# Patient Record
Sex: Male | Born: 1935 | Race: White | Hispanic: No | Marital: Married | Smoking: Former smoker
Health system: Southern US, Community
[De-identification: ages and names within clinical notes are randomized; demographics above are authoritative.]

## PROBLEM LIST (undated history)

## (undated) DIAGNOSIS — N289 Disorder of kidney and ureter, unspecified: Secondary | ICD-10-CM

## (undated) DIAGNOSIS — I10 Essential (primary) hypertension: Secondary | ICD-10-CM

## (undated) DIAGNOSIS — E119 Type 2 diabetes mellitus without complications: Secondary | ICD-10-CM

## (undated) DIAGNOSIS — M1990 Unspecified osteoarthritis, unspecified site: Secondary | ICD-10-CM

## (undated) HISTORY — DX: Unspecified osteoarthritis, unspecified site: M19.90

## (undated) HISTORY — DX: Type 2 diabetes mellitus without complications: E11.9

---
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
## 1980-12-04 HISTORY — PX: TESTICLE SURGERY: SHX794

## 2011-01-16 ENCOUNTER — Ambulatory Visit: Payer: Medicare Other | Admitting: Internal Medicine

## 2012-01-05 LAB — HM DIABETES EYE EXAM

## 2012-07-19 ENCOUNTER — Ambulatory Visit (INDEPENDENT_AMBULATORY_CARE_PROVIDER_SITE_OTHER): Payer: Medicare Other | Admitting: Internal Medicine

## 2012-07-19 ENCOUNTER — Encounter: Payer: Self-pay | Admitting: Internal Medicine

## 2012-07-19 VITALS — BP 136/72 | HR 65 | Temp 97.9°F | Resp 16 | Ht 65.5 in | Wt 177.2 lb

## 2012-07-19 DIAGNOSIS — M7730 Calcaneal spur, unspecified foot: Secondary | ICD-10-CM

## 2012-07-19 DIAGNOSIS — M1710 Unilateral primary osteoarthritis, unspecified knee: Secondary | ICD-10-CM

## 2012-07-19 DIAGNOSIS — G4700 Insomnia, unspecified: Secondary | ICD-10-CM

## 2012-07-19 DIAGNOSIS — E785 Hyperlipidemia, unspecified: Secondary | ICD-10-CM

## 2012-07-19 DIAGNOSIS — I10 Essential (primary) hypertension: Secondary | ICD-10-CM

## 2012-07-19 DIAGNOSIS — Z79899 Other long term (current) drug therapy: Secondary | ICD-10-CM

## 2012-07-19 DIAGNOSIS — E119 Type 2 diabetes mellitus without complications: Secondary | ICD-10-CM

## 2012-07-19 LAB — LIPID PANEL
Cholesterol: 163 mg/dL (ref 0–200)
HDL: 48 mg/dL (ref >39)
Total CHOL/HDL Ratio: 3.4 Ratio

## 2012-07-19 LAB — CBC WITH DIFFERENTIAL/PLATELET
Eosinophils Absolute: 0.1 10*3/uL (ref 0.0–0.7)
Eosinophils Relative: 2 % (ref 0–5)
HCT: 37.8 % — ABNORMAL LOW (ref 39.0–52.0)
Hemoglobin: 13.1 g/dL (ref 13.0–17.0)
Lymphocytes Relative: 22 % (ref 12–46)
Lymphs Abs: 1.7 10*3/uL (ref 0.7–4.0)
MCH: 29.5 pg (ref 26.0–34.0)
MCV: 85.1 fL (ref 78.0–100.0)
Monocytes Absolute: 0.7 10*3/uL (ref 0.1–1.0)
Monocytes Relative: 10 % (ref 3–12)
RBC: 4.44 MIL/uL (ref 4.22–5.81)
WBC: 7.6 10*3/uL (ref 4.0–10.5)

## 2012-07-19 LAB — HEPATIC FUNCTION PANEL
ALT: 13 U/L (ref 0–53)
AST: 15 U/L (ref 0–37)
Albumin: 4.3 g/dL (ref 3.5–5.2)
Total Protein: 6.5 g/dL (ref 6.0–8.3)

## 2012-07-19 LAB — BASIC METABOLIC PANEL
BUN: 41 mg/dL — ABNORMAL HIGH (ref 6–23)
Creat: 2.44 mg/dL — ABNORMAL HIGH (ref 0.50–1.35)

## 2012-07-19 MED ORDER — SIMVASTATIN 20 MG PO TABS: 20.0000 mg | ORAL_TABLET | Freq: Every evening | ORAL | Status: DC

## 2012-07-19 MED ORDER — LISINOPRIL-HYDROCHLOROTHIAZIDE 20-25 MG PO TABS: 1.0000 | ORAL_TABLET | Freq: Every day | ORAL | Status: DC

## 2012-07-19 NOTE — Patient Instructions (Signed)
Please schedule labs prior to next visit Chem7, a1c-250.00 

## 2012-07-20 LAB — HEMOGLOBIN A1C: Hgb A1c MFr Bld: 5.8 % — ABNORMAL HIGH (ref <5.7)

## 2012-07-21 DIAGNOSIS — E785 Hyperlipidemia, unspecified: Secondary | ICD-10-CM | POA: Insufficient documentation

## 2012-07-21 DIAGNOSIS — E1165 Type 2 diabetes mellitus with hyperglycemia: Secondary | ICD-10-CM | POA: Insufficient documentation

## 2012-07-21 DIAGNOSIS — M1710 Unilateral primary osteoarthritis, unspecified knee: Secondary | ICD-10-CM | POA: Insufficient documentation

## 2012-07-21 DIAGNOSIS — G4700 Insomnia, unspecified: Secondary | ICD-10-CM | POA: Insufficient documentation

## 2012-07-21 DIAGNOSIS — M7730 Calcaneal spur, unspecified foot: Secondary | ICD-10-CM | POA: Insufficient documentation

## 2012-07-21 DIAGNOSIS — I10 Essential (primary) hypertension: Secondary | ICD-10-CM | POA: Insufficient documentation

## 2012-07-21 NOTE — Assessment & Plan Note (Signed)
Good control. Obtain cbc, chem7, a1c and urine microalbumin. Order given for diabetic shoes

## 2012-07-21 NOTE — Assessment & Plan Note (Addendum)
Obtain lipid/lft. Intolerant of lipitor

## 2012-07-21 NOTE — Assessment & Plan Note (Signed)
Normotensive and stable. Continue current regimen. Monitor bp as outpt and followup in clinic as scheduled.  

## 2012-07-21 NOTE — Progress Notes (Signed)
  Subjective:    Patient ID: Steve Lowery, male    DOB: [birth year], 78 y.o.   MRN: [id number]  HPI Pt presents to clinic for evaluation of multiple medical problems. H/o DM with avg fsbs 97 without hypoglycemia. Tolerates statin tx without myalgias or abn lft. Requests diabetic shoes. UTD with diabetic eye exam. Suffers from insomnia and states takes unspecified medication prn at home.   Past Medical History  Diagnosis Date  . Diabetes mellitus   . Arthritis    Past Surgical History  Procedure Date  . Tonsillectomy and adenoidectomy 1942  . Testicle surgery 1982    Hydra Seal    reports that he has quit smoking. His smoking use included Pipe. He does not have any smokeless tobacco history on file. His alcohol and drug histories not on file. family history is not on file. No Known Allergies    Review of Systems see hpi     Objective:   Physical Exam  Physical Exam  Nursing note and vitals reviewed. Constitutional: Appears well-developed and well-nourished. No distress.  HENT:  Head: Normocephalic and atraumatic.  Right Ear: External ear normal.  Left Ear: External ear normal.  Eyes: Conjunctivae are normal. No scleral icterus.  Neck: Neck supple. Carotid bruit is not present.  Cardiovascular: Normal rate, regular rhythm and normal heart sounds.  Exam reveals no gallop and no friction rub.   No murmur heard. Pulmonary/Chest: Effort normal and breath sounds normal. No respiratory distress. He has no wheezes. no rales.  Lymphadenopathy:    He has no cervical adenopathy.  Neurological:Alert.  Skin: Skin is warm and dry. Not diaphoretic.  Psychiatric: Has a normal mood and affect.  Diabetic foot exam: +2 DP pulses, no diabetic wounds, ulcerations. +callousing.  Monofilament exam nl.       Assessment & Plan:

## 2012-07-22 ENCOUNTER — Telehealth: Payer: Self-pay | Admitting: Internal Medicine

## 2012-07-22 DIAGNOSIS — N289 Disorder of kidney and ureter, unspecified: Secondary | ICD-10-CM

## 2012-07-22 MED ORDER — ZALEPLON 5 MG PO CAPS: 5.0000 mg | ORAL_CAPSULE | Freq: Every day | ORAL | Status: DC

## 2012-07-22 NOTE — Telephone Encounter (Signed)
Is zaleplon ok to send to pharmacy as well ?... 07/22/12@11 :18am/LMB

## 2012-07-22 NOTE — Telephone Encounter (Signed)
Notified pt with md response. Will call office back to schedule appt in the 7-10 days. Notified [facility] called [facility] into pharmacy spoke with Bricks.Marland KitchenMarland Kitchen8/19/13@3 :48pm/LMB

## 2012-07-22 NOTE — Telephone Encounter (Signed)
Patient states that he forgot to tell Dr. Rodena Medin at last visit that he was taking zaleplon 5mg  capsule. 1 or 2 at bedtime.   He would like that medication and metformin called in to [facility] on [address] and he would like to be called when these medications are called in.

## 2012-07-22 NOTE — Telephone Encounter (Signed)
Ok to fill the sonata #60 rf1. Do not refill the metformin. Labs reviewed and kidney test is definitely elevated (cr 2.44).  1) stop metformin. Potentially dangrous to continue to take if kidney function is weak 2) can substitute samples of januvia 100mg  but would need to take only 1/2 tablet qam. 3) monitor fsbs daily after med change 4) obtain chem7(dx-renal insufficiency) one week. 5) f/u appt to discuss kidneys 7-10 days. Will potentially need further testing

## 2012-07-29 NOTE — Telephone Encounter (Signed)
Lab order placed & released to [facility] HP/SLS

## 2012-07-29 NOTE — Addendum Note (Signed)
Addended by: Regis Bill on: 07/29/2012 01:28 PM   Modules accepted: Orders

## 2012-07-30 LAB — BASIC METABOLIC PANEL
BUN: 40 mg/dL — ABNORMAL HIGH (ref 6–23)
Potassium: 4.2 mEq/L (ref 3.5–5.3)
Sodium: 144 mEq/L (ref 135–145)

## 2012-08-02 ENCOUNTER — Ambulatory Visit: Payer: Medicare Other | Admitting: Internal Medicine

## 2012-08-12 ENCOUNTER — Other Ambulatory Visit: Payer: Self-pay | Admitting: *Deleted

## 2012-08-12 MED ORDER — SITAGLIPTIN PHOSPHATE 100 MG PO TABS: 50.0000 mg | ORAL_TABLET | Freq: Every morning | ORAL | Status: DC

## 2012-08-12 NOTE — Progress Notes (Signed)
Continue Januvia 100 mg SIG: Take 0.5 tablet QAM per Vo [facility]; patient informed/SLS

## 2012-08-16 ENCOUNTER — Encounter: Payer: Self-pay | Admitting: Internal Medicine

## 2012-08-16 ENCOUNTER — Ambulatory Visit (INDEPENDENT_AMBULATORY_CARE_PROVIDER_SITE_OTHER): Payer: Medicare Other | Admitting: Internal Medicine

## 2012-08-16 VITALS — BP 92/58 | HR 76 | Temp 97.6°F | Resp 16 | Wt 175.5 lb

## 2012-08-16 DIAGNOSIS — N289 Disorder of kidney and ureter, unspecified: Secondary | ICD-10-CM

## 2012-08-16 DIAGNOSIS — E119 Type 2 diabetes mellitus without complications: Secondary | ICD-10-CM

## 2012-08-16 DIAGNOSIS — I10 Essential (primary) hypertension: Secondary | ICD-10-CM

## 2012-08-16 MED ORDER — SIMVASTATIN 20 MG PO TABS: 20.0000 mg | ORAL_TABLET | Freq: Every evening | ORAL | Status: DC

## 2012-08-16 MED ORDER — LISINOPRIL 20 MG PO TABS: 20.0000 mg | ORAL_TABLET | Freq: Every day | ORAL | Status: DC

## 2012-08-16 MED ORDER — SITAGLIPTIN PHOSPHATE 50 MG PO TABS: 50.0000 mg | ORAL_TABLET | Freq: Every morning | ORAL | Status: DC

## 2012-08-16 NOTE — Progress Notes (Signed)
  Subjective:    Patient ID: Steve Lowery, male    DOB: [birth year], 78 y.o.   MRN: [id number]  HPI Pt presents to clinic for followup of multiple medical problems. Recently noted to have elevated creatinine. Repeated and confirmed. No outside records available. Metformin was stopped. Is taking Januvia 50 mg a day samples about adverse affect. Fingerstick blood sugar log from home reviewed with predominance of values in the low 100s range. No hypoglycemia. Blood pressure low normal without dizziness or syncope. No active complaint. Total time of visit approximately 20 minutes of which greater than 50% of time spent in counseling.  Past Medical History  Diagnosis Date  . Diabetes mellitus   . Arthritis    Past Surgical History  Procedure Date  . Tonsillectomy and adenoidectomy 1942  . Testicle surgery 1982    Hydra Seal    reports that he has quit smoking. His smoking use included Pipe. He does not have any smokeless tobacco history on file. His alcohol and drug histories not on file. family history is not on file. Allergies  Allergen Reactions  . Lipitor (Atorvastatin) Nausea Only    GI Issues.      Review of Systems see hpi     Objective:   Physical Exam  Nursing note and vitals reviewed. Constitutional: He appears well-developed and well-nourished. No distress.  Skin: He is not diaphoretic.          Assessment & Plan:

## 2012-08-23 DIAGNOSIS — N289 Disorder of kidney and ureter, unspecified: Secondary | ICD-10-CM | POA: Insufficient documentation

## 2012-08-23 NOTE — Assessment & Plan Note (Signed)
Schedule renal ultrasound and nephrology referral. Avoid anti-inflammatories and the metformin

## 2012-08-23 NOTE — Assessment & Plan Note (Signed)
Stop hydrochlorothiazide

## 2012-08-23 NOTE — Assessment & Plan Note (Signed)
Avoid metformin. Sample and prescription of Januvia 50 mg a day given.

## 2012-08-26 ENCOUNTER — Ambulatory Visit (HOSPITAL_BASED_OUTPATIENT_CLINIC_OR_DEPARTMENT_OTHER)
Admission: RE | Admit: 2012-08-26 | Discharge: 2012-08-26 | Disposition: A | Discharge status: Home / Self Care | Payer: Medicare Other | Source: Ambulatory Visit | Attending: Internal Medicine | Admitting: Internal Medicine

## 2012-08-26 ENCOUNTER — Other Ambulatory Visit (HOSPITAL_BASED_OUTPATIENT_CLINIC_OR_DEPARTMENT_OTHER): Payer: Medicare Other

## 2012-08-26 DIAGNOSIS — N289 Disorder of kidney and ureter, unspecified: Secondary | ICD-10-CM | POA: Insufficient documentation

## 2012-08-26 DIAGNOSIS — N281 Cyst of kidney, acquired: Secondary | ICD-10-CM | POA: Insufficient documentation

## 2012-08-26 DIAGNOSIS — N269 Renal sclerosis, unspecified: Secondary | ICD-10-CM | POA: Insufficient documentation

## 2012-11-13 LAB — HM DIABETES EYE EXAM: HM Diabetic Eye Exam: NORMAL

## 2012-11-18 ENCOUNTER — Ambulatory Visit: Payer: Medicare Other | Admitting: Internal Medicine

## 2012-12-09 ENCOUNTER — Encounter: Payer: Self-pay | Admitting: Internal Medicine

## 2012-12-09 ENCOUNTER — Ambulatory Visit (INDEPENDENT_AMBULATORY_CARE_PROVIDER_SITE_OTHER): Payer: Medicare Other | Admitting: Internal Medicine

## 2012-12-09 VITALS — BP 126/78 | HR 81 | Temp 98.1°F | Resp 16 | Wt 178.5 lb

## 2012-12-09 DIAGNOSIS — N289 Disorder of kidney and ureter, unspecified: Secondary | ICD-10-CM

## 2012-12-09 DIAGNOSIS — I10 Essential (primary) hypertension: Secondary | ICD-10-CM

## 2012-12-09 DIAGNOSIS — E119 Type 2 diabetes mellitus without complications: Secondary | ICD-10-CM

## 2012-12-09 NOTE — Patient Instructions (Signed)
Please schedule fasting labs prior to next visit Chem7, a1c, urine microalbumin-250.00 and lipid/lft-272.4 

## 2012-12-15 NOTE — Progress Notes (Signed)
  Subjective:    Patient ID: Steve Lowery, male    DOB: 09-Jul-1934, 79 y.o.   MRN: [id number]  HPI  Pt presents to clinic for followup of multiple medical problems. Tolerated januvia after metformin stopped due to renal insufficiency. Reviewed renal US showing bilateral atrophy. Now seeing nephrology as requested. Reviewed fsbs log under good control. No hypoglyemia.  Past Medical History  Diagnosis Date  . Diabetes mellitus   . Arthritis    Past Surgical History  Procedure Date  . Tonsillectomy and adenoidectomy 1942  . Testicle surgery 1982    Hydra Seal    reports that he has quit smoking. His smoking use included Pipe. He does not have any smokeless tobacco history on file. His alcohol and drug histories not on file. family history is not on file. Allergies  Allergen Reactions  . Lipitor (Atorvastatin) Nausea Only    GI Issues.      Review of Systems see hpi     Objective:   Physical Exam  Physical Exam  Nursing note and vitals reviewed. Constitutional: Appears well-developed and well-nourished. No distress.  HENT:  Head: Normocephalic and atraumatic.  Right Ear: External ear normal.  Left Ear: External ear normal.  Eyes: Conjunctivae are normal. No scleral icterus.  Neck: Neck supple. Carotid bruit is not present.  Cardiovascular: Normal rate, regular rhythm and normal heart sounds.  Exam reveals no gallop and no friction rub.   No murmur heard. Pulmonary/Chest: Effort normal and breath sounds normal. No respiratory distress. He has no wheezes. no rales.  Lymphadenopathy:    He has no cervical adenopathy.  Neurological:Alert.  Skin: Skin is warm and dry. Not diaphoretic.  Psychiatric: Has a normal mood and affect.        Assessment & Plan:

## 2012-12-15 NOTE — Assessment & Plan Note (Signed)
Good control. Continue [place]. Avoid metformin secondary to creatinine.

## 2012-12-15 NOTE — Assessment & Plan Note (Signed)
Normotensive and stable. Continue current regimen. Monitor bp as outpt and followup in clinic as scheduled.  

## 2012-12-15 NOTE — Assessment & Plan Note (Signed)
Followed by nephrology. Avoid metformin and nsaids

## 2012-12-23 NOTE — Progress Notes (Signed)
  Subjective:    Patient ID: Steve Lowery, male    DOB: 1934-10-29, 79 y.o.   MRN: [id number]  HPI appt cancelled    Review of Systems     Objective:   Physical Exam        Assessment & Plan:

## 2013-01-14 ENCOUNTER — Other Ambulatory Visit: Payer: Self-pay | Admitting: Internal Medicine

## 2013-01-14 NOTE — Telephone Encounter (Signed)
Please advise refill? Doesn't look like Dr Rodena Medin ever filled?

## 2013-01-14 NOTE — Telephone Encounter (Signed)
So check with patient and see where he has gotten this previously and make sure he is hoping we will take it over and what he uses it for and let me know

## 2013-01-14 NOTE — Telephone Encounter (Signed)
Refill- zaleplon 5mg  capsule. Take 1-2 capsules by mouth at bedtime if needed. Last fill 12.23.13

## 2013-01-20 NOTE — Telephone Encounter (Signed)
Ok to refill if he has had it from dr Rodena Medin. May send Sonata with same sig, same number and 1 refill

## 2013-01-20 NOTE — Telephone Encounter (Signed)
pts spouse states she will  Have pt return our call

## 2013-01-20 NOTE — Telephone Encounter (Signed)
I looked again and Dr Rodena Medin did fill this. Pt states he takes this to help him sleep. Please advise?

## 2013-01-20 NOTE — Telephone Encounter (Signed)
Please advise 

## 2013-01-21 ENCOUNTER — Telehealth: Payer: Self-pay | Admitting: Internal Medicine

## 2013-01-21 MED ORDER — ZALEPLON 5 MG PO CAPS: 5.0000 mg | ORAL_CAPSULE | Freq: Every day | ORAL | Status: DC

## 2013-01-21 NOTE — Telephone Encounter (Signed)
Refill- zaleplon 5mg  capsule. Take 1 to 2 capsules by mouth at bedtime if needed. Last fill 12.13.13

## 2013-01-21 NOTE — Telephone Encounter (Signed)
This has already been done.

## 2013-01-21 NOTE — Addendum Note (Signed)
Addended by: Court Joy on: 01/21/2013 10:00 AM   Modules accepted: Orders

## 2013-02-03 ENCOUNTER — Encounter: Payer: Self-pay | Admitting: Internal Medicine

## 2013-03-04 ENCOUNTER — Telehealth: Payer: Self-pay | Admitting: Internal Medicine

## 2013-03-04 NOTE — Telephone Encounter (Signed)
FYIMyriam Jacobson called from [employer].  Steve Lowery has an appt to est with Dr. Yetta Barre Monday April 7 as a transfer from Dr. Rodena Medin.   [facility] called the [facility] office to let them know that Mr. Sellman has had one appt with them, but has declined to make a follow up.  He told them he does not want to go back there.

## 2013-03-10 ENCOUNTER — Ambulatory Visit (INDEPENDENT_AMBULATORY_CARE_PROVIDER_SITE_OTHER): Payer: Medicare Other | Admitting: Internal Medicine

## 2013-03-10 ENCOUNTER — Encounter: Payer: Self-pay | Admitting: Internal Medicine

## 2013-03-10 ENCOUNTER — Ambulatory Visit: Payer: Medicare Other | Admitting: Internal Medicine

## 2013-03-10 VITALS — BP 120/68 | HR 75 | Temp 98.0°F | Resp 16 | Ht 65.5 in | Wt 174.0 lb

## 2013-03-10 DIAGNOSIS — E1165 Type 2 diabetes mellitus with hyperglycemia: Secondary | ICD-10-CM

## 2013-03-10 DIAGNOSIS — G4700 Insomnia, unspecified: Secondary | ICD-10-CM

## 2013-03-10 DIAGNOSIS — N289 Disorder of kidney and ureter, unspecified: Secondary | ICD-10-CM

## 2013-03-10 DIAGNOSIS — E1129 Type 2 diabetes mellitus with other diabetic kidney complication: Secondary | ICD-10-CM

## 2013-03-10 DIAGNOSIS — D510 Vitamin B12 deficiency anemia due to intrinsic factor deficiency: Secondary | ICD-10-CM | POA: Insufficient documentation

## 2013-03-10 DIAGNOSIS — I10 Essential (primary) hypertension: Secondary | ICD-10-CM

## 2013-03-10 DIAGNOSIS — E781 Pure hyperglyceridemia: Secondary | ICD-10-CM | POA: Insufficient documentation

## 2013-03-10 DIAGNOSIS — E785 Hyperlipidemia, unspecified: Secondary | ICD-10-CM

## 2013-03-10 LAB — HM DIABETES FOOT EXAM: HM Diabetic Foot Exam: NORMAL

## 2013-03-10 MED ORDER — ZALEPLON 5 MG PO CAPS: 5.0000 mg | ORAL_CAPSULE | Freq: Every day | ORAL | Status: DC

## 2013-03-10 MED ORDER — LISINOPRIL 20 MG PO TABS: 20.0000 mg | ORAL_TABLET | Freq: Every day | ORAL | Status: DC

## 2013-03-10 MED ORDER — SITAGLIPTIN PHOSPHATE 50 MG PO TABS: 50.0000 mg | ORAL_TABLET | Freq: Every morning | ORAL | Status: DC

## 2013-03-10 MED ORDER — SIMVASTATIN 20 MG PO TABS: 20.0000 mg | ORAL_TABLET | Freq: Every evening | ORAL | Status: AC

## 2013-03-10 NOTE — Patient Instructions (Signed)
Chronic Renal Insufficiency  Chronic renal insufficiency (also called kidney failure) occurs when there is kidney damage done. The damage prevents the kidneys from working like they should.   The kidneys do many important things. They:  · Filter waste out of the blood.  · Regulate the amount of water and various salts in the blood stream.  · Produce chemicals that:  · Prompt the bone marrow to make red blood cells.  · Regulate blood pressure.  · Keep calcium in balance throughout the bones and the body.  When the kidneys are damaged, they can no longer filter waste products out of the blood. These substances build up in the blood, causing illness.   CAUSES   · Diabetes.  · High blood pressure.  · Glomerular diseases: Conditions that damage the tiny blood vessels (glomeruli) within the kidneys, such as:  · Membranous nephropathy.  · IgA nephropathy.  · Focal segmental glomerulosclerosis.  · Poisons (such as overdoses or misuse of acetaminophen or NSAIDS, or exposure to other toxic substances).  · Kidney injuries.  · Kidney cancer or cancer that spreads to the kidney.  · Medications such as NSAIDs. These problems are rare.  · Kidney stones.  · Alport disease.  · Polycystic kidneys.  SYMPTOMS   Most people do not notice symptoms of kidney failure until their kidney function drops below about 30-40% of normal. Symptoms can include:  · Weakness.  · Tiredness.  · Frequent urination.  · Intense need to urinate.  · Excess bruising.  · Low urine production.  · Blood in the urine.  · Pain in the kidney area.  · Feeling sick to your stomach (nausea).  · Vomiting.  · Unusual bleeding.  · Numbness in hands and feet.  · Swelling in legs, arms and face.  · Confusion.  DIAGNOSIS   Your caregiver will look for signs of kidney failure. Tests to diagnose kidney failure may include:  · Urine tests: May reveal the presence of blood, protein or sugar.  · Blood tests: May show low red blood cell count (anemia) or high levels of waste  products (BUN and creatinine) that are normally filtered out of the bloodstream by the kidneys.  · Imaging tests  These are tests that create pictures of the organs inside the abdomen, such as the kidneys. They may reveal masses growing in the kidneys or blockages to the flow of urine. Possible imaging tests may include:  · Ultrasound.  · CT scan.  · MRI.  · Intravenous pyelogram or IVP. This is a test that involves injecting dye into the bloodstream and then taking a series of x-rays of the kidneys. This allows the kidneys and other parts of the urinary system to be viewed more clearly.  · Kidney biopsy  A small sample of kidney is removed using a special needle. The sample is examined for abnormalities under a microscope.  TREATMENT   Chronic kidney failure cannot usually be cured. The various symptoms are treated, and measures are taken to avoid further kidney damage.  Treatment for mild to moderate kidney failure may include:  · Medication for high blood pressure.  · Good control of diabetes.  · Medication and diet change to improve anemia.  · A low-sodium, low-potassium, low-protein and/or low-cholesterol diet.  · Limiting the quantity of liquids in the diet.  Treatment for more severe kidney failure may require:  · Dialysis  Mechanical methods of filtering the blood.  · Kidney transplant  An operation that   removes the diseased kidney and replaces it with a donated kidney.  HOME CARE INSTRUCTIONS   · Take medication as told by your caregiver.  · Quit smoking if you are a smoker. Talk to your caregiver about a smoking cessation program.  · Follow your prescribed diet.  · If you are prescribed vitamins, take them as told.  SEEK IMMEDIATE MEDICAL CARE IF:  · You start to produce less urine.  · You notice blood in your urine.  · You have increased pain.  · You have increased weakness, fatigue or confusion.  · You notice new swelling.  · You develop a fever.  · You feel that you are having side effects of medicines  prescribed.  Document Released: 08/29/2008 Document Revised: 02/12/2012 Document Reviewed: 12/12/2010  ExitCare® Patient Information ©2013 [employer].

## 2013-03-10 NOTE — Assessment & Plan Note (Signed)
I will recheck his renal function today and will advise further if needed

## 2013-03-10 NOTE — Assessment & Plan Note (Signed)
His BP is well controlled 

## 2013-03-10 NOTE — Assessment & Plan Note (Signed)
Non-fasting lipid panel today

## 2013-03-10 NOTE — Progress Notes (Signed)
Subjective:    Patient ID: Steve Lowery, male    DOB: 1934-07-04, 79 y.o.   MRN: [id number]  Hypertension This is a chronic problem. The current episode started more than 1 year ago. The problem has been gradually improving since onset. The problem is controlled. Pertinent negatives include no anxiety, blurred vision, chest pain, headaches, malaise/fatigue, neck pain, orthopnea, palpitations, peripheral edema, PND, shortness of breath or sweats. There are no associated agents to hypertension. Past treatments include ACE inhibitors. Compliance problems include exercise and diet.  Hypertensive end-organ damage includes kidney disease. Identifiable causes of hypertension include chronic renal disease.      Review of Systems  Constitutional: Negative.  Negative for fever, chills, malaise/fatigue, diaphoresis, activity change, appetite change, fatigue and unexpected weight change.  HENT: Negative.  Negative for neck pain.   Eyes: Negative.  Negative for blurred vision.  Respiratory: Negative.  Negative for apnea, cough, choking, chest tightness, shortness of breath, wheezing and stridor.   Cardiovascular: Negative.  Negative for chest pain, palpitations, orthopnea and PND.  Gastrointestinal: Negative.  Negative for nausea, vomiting, abdominal pain, diarrhea, constipation and blood in stool.  Endocrine: Negative.   Genitourinary: Negative.   Musculoskeletal: Negative.  Negative for myalgias, back pain, joint swelling, arthralgias and gait problem.  Skin: Negative.  Negative for color change, pallor, rash and wound.  Allergic/Immunologic: Negative.   Neurological: Negative for dizziness, weakness, light-headedness, numbness and headaches.  Hematological: Negative.  Negative for adenopathy. Does not bruise/bleed easily.  Psychiatric/Behavioral: Positive for sleep disturbance (dfa). Negative for suicidal ideas, hallucinations, behavioral problems, confusion, self-injury, dysphoric mood, decreased  concentration and agitation. The patient is not nervous/anxious and is not hyperactive.        Objective:   Physical Exam  Vitals reviewed. Constitutional: He is oriented to person, place, and time. He appears well-developed and well-nourished. No distress.  HENT:  Head: Normocephalic and atraumatic.  Mouth/Throat: Oropharynx is clear and moist. No oropharyngeal exudate.  Eyes: Conjunctivae are normal. Right eye exhibits no discharge. Left eye exhibits no discharge. No scleral icterus.  Neck: Normal range of motion. Neck supple. No JVD present. No tracheal deviation present. No thyromegaly present.  Cardiovascular: Normal rate, regular rhythm, normal heart sounds and intact distal pulses.  Exam reveals no gallop and no friction rub.   No murmur heard. Pulmonary/Chest: Effort normal and breath sounds normal. No stridor. No respiratory distress. He has no wheezes. He has no rales. He exhibits no tenderness.  Abdominal: Soft. Bowel sounds are normal. He exhibits no distension and no mass. There is no tenderness. There is no rebound and no guarding.  Musculoskeletal: Normal range of motion. He exhibits edema (trace edema in BLE). He exhibits no tenderness.  Lymphadenopathy:    He has no cervical adenopathy.  Neurological: He is oriented to person, place, and time.  Skin: Skin is warm and dry. No rash noted. He is not diaphoretic. No erythema. No pallor.  Psychiatric: He has a normal mood and affect. His behavior is normal. Judgment and thought content normal.      Lab Results  Component Value Date   WBC 7.6 07/19/2012   HGB 13.1 07/19/2012   HCT 37.8* 07/19/2012   PLT 182 07/19/2012   GLUCOSE 199* 07/29/2012   CHOL 163 07/19/2012   TRIG 237* 07/19/2012   HDL 48 07/19/2012   LDLCALC 68 07/19/2012   ALT 13 07/19/2012   AST 15 07/19/2012   NA 144 07/29/2012   K 4.2 07/29/2012   CL 105 07/29/2012  CREATININE 2.41* 07/29/2012   BUN 40* 07/29/2012   CO2 28 07/29/2012   HGBA1C 5.8* 07/19/2012       Assessment & Plan:

## 2013-03-10 NOTE — Assessment & Plan Note (Signed)
I will recheck his A1C today and will advise further, will also monitor his renal function

## 2013-03-10 NOTE — Assessment & Plan Note (Signed)
He is doing well on zocor 

## 2013-03-10 NOTE — Assessment & Plan Note (Signed)
The software blocked the folate and B12 ordered I will recheck his CBC and iron level as those tests were allowed today

## 2013-03-10 NOTE — Assessment & Plan Note (Signed)
He will continue sonata as needed

## 2013-03-19 ENCOUNTER — Encounter: Payer: Self-pay | Admitting: Internal Medicine

## 2013-03-20 MED ORDER — OMEPRAZOLE 20 MG PO CPDR: 20.0000 mg | DELAYED_RELEASE_CAPSULE | Freq: Every day | ORAL | Status: AC | PRN

## 2013-03-24 ENCOUNTER — Encounter: Payer: Self-pay | Admitting: Internal Medicine

## 2013-03-24 ENCOUNTER — Ambulatory Visit (INDEPENDENT_AMBULATORY_CARE_PROVIDER_SITE_OTHER): Payer: Medicare Other | Admitting: Internal Medicine

## 2013-03-24 ENCOUNTER — Ambulatory Visit (INDEPENDENT_AMBULATORY_CARE_PROVIDER_SITE_OTHER)
Admission: RE | Admit: 2013-03-24 | Discharge: 2013-03-24 | Disposition: A | Discharge status: Home / Self Care | Payer: Medicare Other | Source: Ambulatory Visit | Attending: Internal Medicine | Admitting: Internal Medicine

## 2013-03-24 VITALS — BP 150/70 | HR 61 | Temp 97.6°F | Resp 16

## 2013-03-24 DIAGNOSIS — E1129 Type 2 diabetes mellitus with other diabetic kidney complication: Secondary | ICD-10-CM

## 2013-03-24 DIAGNOSIS — N289 Disorder of kidney and ureter, unspecified: Secondary | ICD-10-CM

## 2013-03-24 DIAGNOSIS — M25512 Pain in left shoulder: Secondary | ICD-10-CM

## 2013-03-24 DIAGNOSIS — I10 Essential (primary) hypertension: Secondary | ICD-10-CM

## 2013-03-24 DIAGNOSIS — M25519 Pain in unspecified shoulder: Secondary | ICD-10-CM

## 2013-03-24 NOTE — Progress Notes (Signed)
Pt refused to have his weight taken.

## 2013-03-24 NOTE — Patient Instructions (Signed)
Shoulder Pain The shoulder is the joint that connects your arms to your body. The bones that form the shoulder joint include the upper arm bone (humerus), the shoulder blade (scapula), and the collarbone (clavicle). The top of the humerus is shaped like a ball and fits into a rather flat socket on the scapula (glenoid cavity). A combination of muscles and strong, fibrous tissues that connect muscles to bones (tendons) support your shoulder joint and hold the ball in the socket. Small, fluid-filled sacs (bursae) are located in different areas of the joint. They act as cushions between the bones and the overlying soft tissues and help reduce friction between the gliding tendons and the bone as you move your arm. Your shoulder joint allows a wide range of motion in your arm. This range of motion allows you to do things like scratch your back or throw a ball. However, this range of motion also makes your shoulder more prone to pain from overuse and injury. Causes of shoulder pain can originate from both injury and overuse and usually can be grouped in the following four categories:  Redness, swelling, and pain (inflammation) of the tendon (tendinitis) or the bursae (bursitis).  Instability, such as a dislocation of the joint.  Inflammation of the joint (arthritis).  Broken bone (fracture). HOME CARE INSTRUCTIONS   Apply ice to the sore area.  Put ice in a plastic bag.  Place a towel between your skin and the bag.  Leave the ice on for 15 to 20 minutes, 3 to 4 times per day for the first 2 days.  If you have a shoulder sling or immobilizer, wear it as long as your caregiver instructs. Only remove it to shower or bathe. Move your arm as little as possible, but keep your hand moving to prevent swelling.  Only take over-the-counter or prescription medicines for pain, discomfort, or fever as directed by your caregiver. SEEK MEDICAL CARE IF:   Your shoulder pain increases, or new pain develops in  your arm, hand, or fingers.  Your hand or fingers become cold and numb.  Your pain is not relieved with medicines. SEEK IMMEDIATE MEDICAL CARE IF:   Your arm, hand, or fingers are numb or tingling.  Your arm, hand, or fingers are significantly swollen or turn white or blue. MAKE SURE YOU:   Understand these instructions.  Will watch your condition.  Will get help right away if you are not doing well or get worse. Document Released: 08/30/2005 Document Revised: 02/12/2012 Document Reviewed: 11/04/2011 ExitCare Patient Information 2013 [employer].  

## 2013-03-25 ENCOUNTER — Encounter: Payer: Self-pay | Admitting: Internal Medicine

## 2013-03-25 ENCOUNTER — Telehealth: Payer: Self-pay | Admitting: *Deleted

## 2013-03-25 NOTE — Assessment & Plan Note (Signed)
Plain film is normal He does not want anything for pain I suspect that this is m/s strain

## 2013-03-25 NOTE — Assessment & Plan Note (Signed)
He refuses to do his lab work

## 2013-03-25 NOTE — Assessment & Plan Note (Signed)
He has adequate BP control 

## 2013-03-25 NOTE — Telephone Encounter (Signed)
Left msg on triage requesting xray results. Called pt back inform him md release results to my chart. Xray was normal.../lmb

## 2013-03-25 NOTE — Progress Notes (Signed)
  Subjective:    Patient ID: Steve Lowery, male    DOB: 1934-01-25, 79 y.o.   MRN: [id number]  Shoulder Pain  The pain is present in the left shoulder. This is a new problem. The current episode started in the past 7 days. There has been no history of extremity trauma. The problem occurs intermittently. The problem has been gradually improving. The quality of the pain is described as aching. The pain is at a severity of 2/10. The pain is mild. Associated symptoms include stiffness. Pertinent negatives include no fever, inability to bear weight, itching, joint locking, joint swelling, limited range of motion, numbness or tingling. The symptoms are aggravated by activity. He has tried acetaminophen for the symptoms. The treatment provided moderate relief. Family history does not include gout or rheumatoid arthritis. His past medical history is significant for diabetes and osteoarthritis. There is no history of gout or rheumatoid arthritis.      Review of Systems  Constitutional: Negative.  Negative for fever.  HENT: Negative.   Eyes: Negative.   Respiratory: Negative.  Negative for cough, chest tightness, shortness of breath, wheezing and stridor.   Cardiovascular: Negative.  Negative for chest pain, palpitations and leg swelling.  Gastrointestinal: Negative.  Negative for nausea, vomiting, diarrhea, constipation, abdominal distention and anal bleeding.  Endocrine: Negative.   Genitourinary: Negative.   Musculoskeletal: Positive for arthralgias and stiffness. Negative for myalgias, back pain, joint swelling, gait problem and gout.  Skin: Negative.  Negative for itching.  Allergic/Immunologic: Negative.   Neurological: Negative.  Negative for dizziness, tingling and numbness.  Hematological: Negative.  Negative for adenopathy. Does not bruise/bleed easily.  Psychiatric/Behavioral: Negative.        Objective:   Physical Exam  Vitals reviewed. Constitutional: He is oriented to person, place,  and time. He appears well-developed and well-nourished. No distress.  HENT:  Head: Normocephalic and atraumatic.  Mouth/Throat: Oropharynx is clear and moist. No oropharyngeal exudate.  Eyes: Conjunctivae are normal. Right eye exhibits no discharge. Left eye exhibits no discharge. No scleral icterus.  Neck: Normal range of motion. Neck supple. No JVD present. No tracheal deviation present. No thyromegaly present.  Cardiovascular: Normal rate, regular rhythm, normal heart sounds and intact distal pulses.  Exam reveals no gallop and no friction rub.   No murmur heard. Pulmonary/Chest: Effort normal and breath sounds normal. No stridor. No respiratory distress. He has no wheezes. He has no rales. He exhibits no tenderness.  Abdominal: Soft. Bowel sounds are normal. He exhibits no distension and no mass. There is no tenderness. There is no rebound and no guarding.  Musculoskeletal: Normal range of motion. He exhibits no edema and no tenderness.       Left shoulder: He exhibits tenderness (mild ttp in the a/c joint) and bony tenderness. He exhibits normal range of motion, no swelling, no effusion, no crepitus, no deformity, no laceration, no pain, no spasm, normal pulse and normal strength.  Lymphadenopathy:    He has no cervical adenopathy.  Neurological: He is oriented to person, place, and time.  Skin: Skin is warm and dry. No rash noted. He is not diaphoretic. No erythema. No pallor.  Psychiatric: He has a normal mood and affect. His behavior is normal. Judgment and thought content normal.          Assessment & Plan:

## 2013-03-25 NOTE — Assessment & Plan Note (Signed)
He refuses to do lab work

## 2013-06-17 ENCOUNTER — Other Ambulatory Visit: Payer: Self-pay | Admitting: Internal Medicine

## 2013-10-09 ENCOUNTER — Other Ambulatory Visit: Payer: Self-pay

## 2013-10-17 ENCOUNTER — Telehealth: Payer: Self-pay

## 2013-10-17 NOTE — Telephone Encounter (Signed)
Pt left a message on my vm stating that he called and left a message over 24 hours ago and my vm states I would return any calls within 24 hours? Pt stated he would like to know why his "damn" call wasn't returned. I tried to call pt back but got his answering machine. I'm assuming he's calling about his wife since she is a pt of Dr Elby Showers?  I apologized on pts machine and stated that I had been out of the office since Nov 10, 14 in meetings and didn't return until Nov. 14, 14 around 10.

## 2015-03-04 ENCOUNTER — Inpatient Hospital Stay (HOSPITAL_COMMUNITY)
Admission: EM | Admit: 2015-03-04 | Discharge: 2015-03-08 | DRG: 474 | Disposition: A | Discharge status: Skilled Nursing Facility | Payer: Medicare Other | Attending: Internal Medicine | Admitting: Internal Medicine

## 2015-03-04 ENCOUNTER — Inpatient Hospital Stay (HOSPITAL_COMMUNITY): Payer: Medicare Other

## 2015-03-04 ENCOUNTER — Emergency Department (HOSPITAL_COMMUNITY): Payer: Medicare Other

## 2015-03-04 ENCOUNTER — Encounter (HOSPITAL_COMMUNITY)
Admission: EM | Disposition: A | Discharge status: Skilled Nursing Facility | Payer: Self-pay | Source: Home / Self Care | Attending: Internal Medicine

## 2015-03-04 ENCOUNTER — Inpatient Hospital Stay (HOSPITAL_COMMUNITY): Payer: Medicare Other | Admitting: Certified Registered Nurse Anesthetist

## 2015-03-04 ENCOUNTER — Encounter (HOSPITAL_COMMUNITY): Payer: Self-pay | Admitting: Emergency Medicine

## 2015-03-04 DIAGNOSIS — D72829 Elevated white blood cell count, unspecified: Secondary | ICD-10-CM | POA: Diagnosis present

## 2015-03-04 DIAGNOSIS — Z888 Allergy status to other drugs, medicaments and biological substances status: Secondary | ICD-10-CM | POA: Diagnosis not present

## 2015-03-04 DIAGNOSIS — Z79899 Other long term (current) drug therapy: Secondary | ICD-10-CM

## 2015-03-04 DIAGNOSIS — L97509 Non-pressure chronic ulcer of other part of unspecified foot with unspecified severity: Secondary | ICD-10-CM | POA: Diagnosis present

## 2015-03-04 DIAGNOSIS — E1140 Type 2 diabetes mellitus with diabetic neuropathy, unspecified: Secondary | ICD-10-CM | POA: Diagnosis present

## 2015-03-04 DIAGNOSIS — I10 Essential (primary) hypertension: Secondary | ICD-10-CM | POA: Diagnosis not present

## 2015-03-04 DIAGNOSIS — Z6827 Body mass index (BMI) 27.0-27.9, adult: Secondary | ICD-10-CM | POA: Diagnosis not present

## 2015-03-04 DIAGNOSIS — E876 Hypokalemia: Secondary | ICD-10-CM | POA: Diagnosis present

## 2015-03-04 DIAGNOSIS — E785 Hyperlipidemia, unspecified: Secondary | ICD-10-CM | POA: Diagnosis present

## 2015-03-04 DIAGNOSIS — M14672 Charcot's joint, left ankle and foot: Secondary | ICD-10-CM | POA: Diagnosis present

## 2015-03-04 DIAGNOSIS — E11628 Type 2 diabetes mellitus with other skin complications: Secondary | ICD-10-CM | POA: Diagnosis present

## 2015-03-04 DIAGNOSIS — Z7982 Long term (current) use of aspirin: Secondary | ICD-10-CM

## 2015-03-04 DIAGNOSIS — D510 Vitamin B12 deficiency anemia due to intrinsic factor deficiency: Secondary | ICD-10-CM | POA: Diagnosis present

## 2015-03-04 DIAGNOSIS — E1169 Type 2 diabetes mellitus with other specified complication: Secondary | ICD-10-CM | POA: Diagnosis not present

## 2015-03-04 DIAGNOSIS — M009 Pyogenic arthritis, unspecified: Secondary | ICD-10-CM | POA: Diagnosis present

## 2015-03-04 DIAGNOSIS — M869 Osteomyelitis, unspecified: Secondary | ICD-10-CM | POA: Diagnosis present

## 2015-03-04 DIAGNOSIS — Z87891 Personal history of nicotine dependence: Secondary | ICD-10-CM | POA: Diagnosis not present

## 2015-03-04 DIAGNOSIS — I129 Hypertensive chronic kidney disease with stage 1 through stage 4 chronic kidney disease, or unspecified chronic kidney disease: Secondary | ICD-10-CM | POA: Diagnosis present

## 2015-03-04 DIAGNOSIS — N184 Chronic kidney disease, stage 4 (severe): Secondary | ICD-10-CM | POA: Diagnosis present

## 2015-03-04 DIAGNOSIS — E43 Unspecified severe protein-calorie malnutrition: Secondary | ICD-10-CM | POA: Diagnosis present

## 2015-03-04 DIAGNOSIS — M726 Necrotizing fasciitis: Secondary | ICD-10-CM | POA: Diagnosis present

## 2015-03-04 DIAGNOSIS — D631 Anemia in chronic kidney disease: Secondary | ICD-10-CM | POA: Diagnosis present

## 2015-03-04 DIAGNOSIS — M79672 Pain in left foot: Secondary | ICD-10-CM | POA: Diagnosis present

## 2015-03-04 DIAGNOSIS — Z833 Family history of diabetes mellitus: Secondary | ICD-10-CM

## 2015-03-04 DIAGNOSIS — L089 Local infection of the skin and subcutaneous tissue, unspecified: Secondary | ICD-10-CM | POA: Diagnosis present

## 2015-03-04 DIAGNOSIS — L0390 Cellulitis, unspecified: Secondary | ICD-10-CM | POA: Diagnosis present

## 2015-03-04 DIAGNOSIS — M1990 Unspecified osteoarthritis, unspecified site: Secondary | ICD-10-CM | POA: Diagnosis present

## 2015-03-04 HISTORY — DX: Disorder of kidney and ureter, unspecified: N28.9

## 2015-03-04 HISTORY — PX: AMPUTATION: SHX166

## 2015-03-04 HISTORY — DX: Essential (primary) hypertension: I10

## 2015-03-04 LAB — COMPREHENSIVE METABOLIC PANEL
ALT: 13 U/L (ref 0–53)
AST: 22 U/L (ref 0–37)
Albumin: 2.3 g/dL — ABNORMAL LOW (ref 3.5–5.2)
Alkaline Phosphatase: 68 U/L (ref 39–117)
Anion gap: 13 (ref 5–15)
BUN: 47 mg/dL — AB (ref 6–23)
CALCIUM: 8.6 mg/dL (ref 8.4–10.5)
CHLORIDE: 104 mmol/L (ref 96–112)
CO2: 21 mmol/L (ref 19–32)
Creatinine, Ser: 2.41 mg/dL — ABNORMAL HIGH (ref 0.50–1.35)
GFR calc non Af Amer: 24 mL/min — ABNORMAL LOW (ref >90)
GFR, EST AFRICAN AMERICAN: 28 mL/min — AB (ref >90)
GLUCOSE: 179 mg/dL — AB (ref 70–99)
Potassium: 3.3 mmol/L — ABNORMAL LOW (ref 3.5–5.1)
Sodium: 138 mmol/L (ref 135–145)
Total Bilirubin: 1 mg/dL (ref 0.3–1.2)
Total Protein: 6 g/dL (ref 6.0–8.3)

## 2015-03-04 LAB — PHOSPHORUS: Phosphorus: 3.1 mg/dL (ref 2.3–4.6)

## 2015-03-04 LAB — BASIC METABOLIC PANEL
Anion gap: 13 (ref 5–15)
BUN: 53 mg/dL — AB (ref 6–23)
CO2: 21 mmol/L (ref 19–32)
CREATININE: 2.59 mg/dL — AB (ref 0.50–1.35)
Calcium: 8.8 mg/dL (ref 8.4–10.5)
Chloride: 102 mmol/L (ref 96–112)
GFR calc Af Amer: 26 mL/min — ABNORMAL LOW (ref >90)
GFR, EST NON AFRICAN AMERICAN: 22 mL/min — AB (ref >90)
GLUCOSE: 180 mg/dL — AB (ref 70–99)
Potassium: 3.2 mmol/L — ABNORMAL LOW (ref 3.5–5.1)
SODIUM: 136 mmol/L (ref 135–145)

## 2015-03-04 LAB — CBC WITH DIFFERENTIAL/PLATELET
BASOS ABS: 0 10*3/uL (ref 0.0–0.1)
BASOS PCT: 0 % (ref 0–1)
EOS PCT: 1 % (ref 0–5)
Eosinophils Absolute: 0.1 10*3/uL (ref 0.0–0.7)
HEMATOCRIT: 34.9 % — AB (ref 39.0–52.0)
Hemoglobin: 11.5 g/dL — ABNORMAL LOW (ref 13.0–17.0)
Lymphocytes Relative: 5 % — ABNORMAL LOW (ref 12–46)
Lymphs Abs: 0.7 10*3/uL (ref 0.7–4.0)
MCH: 28 pg (ref 26.0–34.0)
MCHC: 33 g/dL (ref 30.0–36.0)
MCV: 85.1 fL (ref 78.0–100.0)
MONO ABS: 1.4 10*3/uL — AB (ref 0.1–1.0)
Monocytes Relative: 10 % (ref 3–12)
Neutro Abs: 11.2 10*3/uL — ABNORMAL HIGH (ref 1.7–7.7)
Neutrophils Relative %: 84 % — ABNORMAL HIGH (ref 43–77)
Platelets: 338 10*3/uL (ref 150–400)
RBC: 4.1 MIL/uL — ABNORMAL LOW (ref 4.22–5.81)
RDW: 13.6 % (ref 11.5–15.5)
WBC: 13.3 10*3/uL — AB (ref 4.0–10.5)

## 2015-03-04 LAB — GLUCOSE, CAPILLARY
GLUCOSE-CAPILLARY: 116 mg/dL — AB (ref 70–99)
Glucose-Capillary: 131 mg/dL — ABNORMAL HIGH (ref 70–99)

## 2015-03-04 LAB — SEDIMENTATION RATE: Sed Rate: 120 mm/hr — ABNORMAL HIGH (ref 0–16)

## 2015-03-04 LAB — TYPE AND SCREEN
ABO/RH(D): AB POS
Antibody Screen: NEGATIVE

## 2015-03-04 LAB — TSH: TSH: 0.462 u[IU]/mL (ref 0.350–4.500)

## 2015-03-04 LAB — I-STAT CG4 LACTIC ACID, ED
LACTIC ACID, VENOUS: 1.45 mmol/L (ref 0.5–2.0)
Lactic Acid, Venous: 1.33 mmol/L (ref 0.5–2.0)

## 2015-03-04 LAB — MAGNESIUM: Magnesium: 2 mg/dL (ref 1.5–2.5)

## 2015-03-04 LAB — SURGICAL PCR SCREEN
MRSA, PCR: NEGATIVE
Staphylococcus aureus: NEGATIVE

## 2015-03-04 SURGERY — AMPUTATION BELOW KNEE
Anesthesia: General | Site: Leg Lower | Laterality: Left

## 2015-03-04 MED ORDER — LACTATED RINGERS IV SOLN
INTRAVENOUS | Status: DC | PRN
  Administered 2015-03-04: 22:00:00 via INTRAVENOUS

## 2015-03-04 MED ORDER — ACETAMINOPHEN 10 MG/ML IV SOLN
1000.0000 mg | Freq: Once | INTRAVENOUS | Status: AC
  Administered 2015-03-04: 1000 mg via INTRAVENOUS
  Filled 2015-03-04: qty 100

## 2015-03-04 MED ORDER — 0.9 % SODIUM CHLORIDE (POUR BTL) OPTIME
TOPICAL | Status: DC | PRN
  Administered 2015-03-04: 3000 mL

## 2015-03-04 MED ORDER — VANCOMYCIN HCL IN DEXTROSE 750-5 MG/150ML-% IV SOLN
750.0000 mg | INTRAVENOUS | Status: DC
  Administered 2015-03-05 – 2015-03-06 (×2): 750 mg via INTRAVENOUS
  Filled 2015-03-04 (×3): qty 150

## 2015-03-04 MED ORDER — MORPHINE SULFATE 2 MG/ML IJ SOLN
1.0000 mg | INTRAMUSCULAR | Status: DC | PRN
  Administered 2015-03-05 (×2): 1 mg via INTRAVENOUS
  Filled 2015-03-04 (×2): qty 1

## 2015-03-04 MED ORDER — PANTOPRAZOLE SODIUM 40 MG PO TBEC
40.0000 mg | DELAYED_RELEASE_TABLET | Freq: Every day | ORAL | Status: DC
  Administered 2015-03-05 – 2015-03-08 (×3): 40 mg via ORAL
  Filled 2015-03-04 (×4): qty 1

## 2015-03-04 MED ORDER — OMEGA-3-ACID ETHYL ESTERS 1 G PO CAPS
1.0000 g | ORAL_CAPSULE | Freq: Every day | ORAL | Status: DC
  Administered 2015-03-05 – 2015-03-08 (×4): 1 g via ORAL
  Filled 2015-03-04 (×4): qty 1

## 2015-03-04 MED ORDER — PIPERACILLIN-TAZOBACTAM 3.375 G IVPB
3.3750 g | Freq: Three times a day (TID) | INTRAVENOUS | Status: DC
  Administered 2015-03-04 – 2015-03-07 (×10): 3.375 g via INTRAVENOUS
  Filled 2015-03-04 (×10): qty 50

## 2015-03-04 MED ORDER — FENTANYL CITRATE 0.05 MG/ML IJ SOLN
INTRAMUSCULAR | Status: AC
  Filled 2015-03-04: qty 5

## 2015-03-04 MED ORDER — LIDOCAINE HCL (CARDIAC) 20 MG/ML IV SOLN
INTRAVENOUS | Status: AC
  Filled 2015-03-04: qty 5

## 2015-03-04 MED ORDER — PROPOFOL 10 MG/ML IV BOLUS
INTRAVENOUS | Status: DC | PRN
  Administered 2015-03-04: 150 mg via INTRAVENOUS

## 2015-03-04 MED ORDER — VANCOMYCIN HCL IN DEXTROSE 1-5 GM/200ML-% IV SOLN
1000.0000 mg | Freq: Once | INTRAVENOUS | Status: AC
  Administered 2015-03-04: 1000 mg via INTRAVENOUS
  Filled 2015-03-04: qty 200

## 2015-03-04 MED ORDER — ACETAMINOPHEN 650 MG RE SUPP: 650.0000 mg | Freq: Four times a day (QID) | RECTAL | Status: DC | PRN

## 2015-03-04 MED ORDER — ACETAMINOPHEN 325 MG PO TABS: 650.0000 mg | ORAL_TABLET | Freq: Four times a day (QID) | ORAL | Status: DC | PRN

## 2015-03-04 MED ORDER — PHENYLEPHRINE HCL 10 MG/ML IJ SOLN
INTRAMUSCULAR | Status: DC | PRN
  Administered 2015-03-04 (×2): 80 ug via INTRAVENOUS
  Administered 2015-03-04: 120 ug via INTRAVENOUS

## 2015-03-04 MED ORDER — HYDROMORPHONE HCL 2 MG/ML IJ SOLN
INTRAMUSCULAR | Status: AC
  Filled 2015-03-04: qty 1

## 2015-03-04 MED ORDER — PIPERACILLIN-TAZOBACTAM 3.375 G IVPB
3.3750 g | Freq: Once | INTRAVENOUS | Status: AC
  Administered 2015-03-04: 3.375 g via INTRAVENOUS
  Filled 2015-03-04: qty 50

## 2015-03-04 MED ORDER — PROPOFOL 10 MG/ML IV BOLUS
INTRAVENOUS | Status: AC
  Filled 2015-03-04: qty 20

## 2015-03-04 MED ORDER — ONDANSETRON HCL 4 MG PO TABS
4.0000 mg | ORAL_TABLET | Freq: Four times a day (QID) | ORAL | Status: DC | PRN
Start: 2015-03-04 — End: 2015-03-05

## 2015-03-04 MED ORDER — INSULIN ASPART 100 UNIT/ML ~~LOC~~ SOLN
0.0000 [IU] | Freq: Three times a day (TID) | SUBCUTANEOUS | Status: DC
  Administered 2015-03-06: 1 [IU] via SUBCUTANEOUS

## 2015-03-04 MED ORDER — ASPIRIN EC 81 MG PO TBEC: 81.0000 mg | DELAYED_RELEASE_TABLET | Freq: Every day | ORAL | Status: DC

## 2015-03-04 MED ORDER — FENTANYL CITRATE 0.05 MG/ML IJ SOLN
INTRAMUSCULAR | Status: DC | PRN
  Administered 2015-03-04 (×2): 50 ug via INTRAVENOUS
  Administered 2015-03-04: 100 ug via INTRAVENOUS
  Administered 2015-03-05: 50 ug via INTRAVENOUS

## 2015-03-04 MED ORDER — SUCCINYLCHOLINE CHLORIDE 20 MG/ML IJ SOLN
INTRAMUSCULAR | Status: DC | PRN
  Administered 2015-03-04: 100 mg via INTRAVENOUS

## 2015-03-04 MED ORDER — ENOXAPARIN SODIUM 30 MG/0.3ML ~~LOC~~ SOLN
30.0000 mg | SUBCUTANEOUS | Status: DC
  Filled 2015-03-04: qty 0.3

## 2015-03-04 MED ORDER — SODIUM CHLORIDE 0.9 % IV SOLN
INTRAVENOUS | Status: AC
  Administered 2015-03-04 – 2015-03-05 (×2): via INTRAVENOUS

## 2015-03-04 MED ORDER — HYDROMORPHONE HCL 1 MG/ML IJ SOLN
INTRAMUSCULAR | Status: DC | PRN
  Administered 2015-03-04 – 2015-03-05 (×2): 0.5 mg via INTRAVENOUS

## 2015-03-04 MED ORDER — AMLODIPINE BESYLATE 10 MG PO TABS
10.0000 mg | ORAL_TABLET | Freq: Every day | ORAL | Status: DC
  Administered 2015-03-05 – 2015-03-08 (×3): 10 mg via ORAL
  Filled 2015-03-04 (×4): qty 1

## 2015-03-04 MED ORDER — POTASSIUM CHLORIDE 10 MEQ/100ML IV SOLN
10.0000 meq | INTRAVENOUS | Status: AC
  Administered 2015-03-04 – 2015-03-05 (×3): 10 meq via INTRAVENOUS
  Filled 2015-03-04 (×3): qty 100

## 2015-03-04 MED ORDER — SIMVASTATIN 20 MG PO TABS
20.0000 mg | ORAL_TABLET | Freq: Every evening | ORAL | Status: DC
  Administered 2015-03-05 – 2015-03-07 (×3): 20 mg via ORAL
  Filled 2015-03-04 (×5): qty 1

## 2015-03-04 MED ORDER — ONDANSETRON HCL 4 MG/2ML IJ SOLN
4.0000 mg | Freq: Four times a day (QID) | INTRAMUSCULAR | Status: DC | PRN
Start: 2015-03-04 — End: 2015-03-05

## 2015-03-04 MED ORDER — CLINDAMYCIN PHOSPHATE 600 MG/50ML IV SOLN
600.0000 mg | Freq: Once | INTRAVENOUS | Status: AC
  Administered 2015-03-04: 600 mg via INTRAVENOUS
  Filled 2015-03-04: qty 50

## 2015-03-04 MED ORDER — SODIUM CHLORIDE 0.9 % IV SOLN: INTRAVENOUS | Status: DC

## 2015-03-04 SURGICAL SUPPLY — 42 items
BAG ZIPLOCK 12X15 (MISCELLANEOUS) ×3 IMPLANT
BANDAGE ELASTIC 4 VELCRO ST LF (GAUZE/BANDAGES/DRESSINGS) IMPLANT
BANDAGE ELASTIC 6 VELCRO ST LF (GAUZE/BANDAGES/DRESSINGS) ×3 IMPLANT
BANDAGE ESMARK 6X9 LF (GAUZE/BANDAGES/DRESSINGS) IMPLANT
BLADE GIGLI SAW 510M (BLADE) ×3 IMPLANT
BLADE SAW SGTL 18X1.27X75 (BLADE) ×2 IMPLANT
BLADE SAW SGTL 18X1.27X75MM (BLADE) ×1
BLADE SURG SZ10 CARB STEEL (BLADE) IMPLANT
BNDG ELASTIC 6X10 VLCR STRL LF (GAUZE/BANDAGES/DRESSINGS) ×3 IMPLANT
BNDG ESMARK 6X9 LF (GAUZE/BANDAGES/DRESSINGS)
COVER MAYO STAND STRL (DRAPES) ×3 IMPLANT
CUFF TOURN SGL QUICK 34 (TOURNIQUET CUFF) ×2
CUFF TRNQT CYL 34X4X40X1 (TOURNIQUET CUFF) ×1 IMPLANT
DRSG PAD ABDOMINAL 8X10 ST (GAUZE/BANDAGES/DRESSINGS) ×3 IMPLANT
ELECT REM PT RETURN 9FT ADLT (ELECTROSURGICAL) ×3
ELECTRODE REM PT RTRN 9FT ADLT (ELECTROSURGICAL) ×1 IMPLANT
EVACUATOR 1/8 PVC DRAIN (DRAIN) IMPLANT
EVACUATOR SILICONE 100CC (DRAIN) IMPLANT
GAUZE SPONGE 4X4 12PLY STRL (GAUZE/BANDAGES/DRESSINGS) ×3 IMPLANT
GAUZE XEROFORM 5X9 LF (GAUZE/BANDAGES/DRESSINGS) ×3 IMPLANT
GLOVE SURG ORTHO 8.0 STRL STRW (GLOVE) ×3 IMPLANT
GOWN STRL REUS W/TWL LRG LVL3 (GOWN DISPOSABLE) ×6 IMPLANT
NS IRRIG 1000ML POUR BTL (IV SOLUTION) ×12 IMPLANT
PACK ORTHO EXTREMITY (CUSTOM PROCEDURE TRAY) ×3 IMPLANT
PAD ABD 8X10 STRL (GAUZE/BANDAGES/DRESSINGS) ×6 IMPLANT
PAD CAST 4YDX4 CTTN HI CHSV (CAST SUPPLIES) IMPLANT
PADDING CAST ABS 4INX4YD NS (CAST SUPPLIES) ×4
PADDING CAST ABS COTTON 4X4 ST (CAST SUPPLIES) ×2 IMPLANT
PADDING CAST COTTON 4X4 STRL (CAST SUPPLIES)
PADDING CAST COTTON 6X4 STRL (CAST SUPPLIES) IMPLANT
POSITIONER SURGICAL ARM (MISCELLANEOUS) ×3 IMPLANT
SHIELD SPLASH 9X12 PIC/PGM (MISCELLANEOUS) ×6 IMPLANT
SPLINT FIBERGLASS 5X30 (CAST SUPPLIES) ×3 IMPLANT
SPONGE LAP 18X18 X RAY DECT (DISPOSABLE) ×3 IMPLANT
STAPLER VISISTAT 35W (STAPLE) ×3 IMPLANT
SUT ETHILON 3 0 PS 1 (SUTURE) IMPLANT
SUT VIC AB 0 CT1 36 (SUTURE) ×6 IMPLANT
SUT VIC AB 1 CT1 27 (SUTURE) ×2
SUT VIC AB 1 CT1 27XBRD ANTBC (SUTURE) ×1 IMPLANT
SUT VIC AB 2-0 CT1 27 (SUTURE) ×12
SUT VIC AB 2-0 CT1 TAPERPNT 27 (SUTURE) ×6 IMPLANT
TOWEL OR 17X26 10 PK STRL BLUE (TOWEL DISPOSABLE) ×6 IMPLANT

## 2015-03-04 NOTE — Consult Note (Signed)
WOC wound consult note Reason for Consult: DFU with infection.  Seen by orthopedics (Dr. August Saucerean) this afternoon and patient is being prepared for surgery pending MRI this afternoon.   Wound type:Infectious WOC nursing team will not follow as the current situation exceeds the scope of WOC nursing practice. I will remain available to this patient, the nursing and medical teams.  Please re-consult if needed. Thanks, Ladona MowLaurie Zayli Villafuerte, MSN, RN, GNP, [place]ola[place]OCN, CWON-AP [phone])

## 2015-03-04 NOTE — H&P (Signed)
[facility] History and Physical  Jacobe Study WUJ:[id number] DOB: [birth year] DOA: 03/04/2015  Referring physician: ER physician PCP: Teena Irani, PA-C   Chief Complaint: foot infection  HPI:  81 year old male with past medical history of hypertension, dyslipidemia who presented to [facility] ED with concern for left foot infection. Per patient, he has had progressive pain and swelling in left foot for some time now but unsure exactly for how long. Patient reported ulcer that is draining purulent material. He did not report fevers. No other complaints such as chest pain, shortness of breath or palpitations. No abdominal pain, nausea or vomiting. No GU complaints.  In ED, pt is hemodynamically stable. Blood work is notable for WBC count of 13.3, hemoglobin 11.5, potassium 3.2, creatinine 2.59 (baseline 2.4 in 2013), glucose 180, normal lactic acid. Left foot x ray showed extensive soft tissue emphysema throughout the foot worrisome for soft tissue infection but no definitive osteomyelitis. He has a left foot infection concerning for necrotizing fasciitis. Ortho has seen the pt in consultation. Pt is on broad spectrum abx.   Assessment & Plan    Principal Problem: Charcot's joint of left foot, neuropathy / Possible necrotizing fasciitis / Leukocytosis - Appreciate ortho consult and recommendations very much, surgery imminent but need MRI prior to surgery - Started on broad spectrum abx, vanco and zosyn - Provide supportive care with IV fluids, analgesia   Active Problems: Hyperlipidemia with target LDL less than 100 - Resume simvastatin 20 mg at bedtime and omega 3 supplementation  HTN (hypertension) - Continue Norvasc 10 mg daily - Hold lisinopril due to renal insufficiency  Pernicious anemia / anemia of chronic renal disease - Hemoglobin is 11.5, stable  CKD (chronic kidney disease) stage 4, GFR 15-29 ml/min - Baseline creatinine 2.4 in 2013 - On this admission, Cr 2.59 -  Continue to monitor renal function. - Held lisinopril as it may have contributed to worsening renal function  Hypokalemia - Unclear etiology - Supplemented    DVT prophylaxis:  - Lovenox subQ ordered.   Radiological Exams on Admission: Dg Ankle Complete Left 03/04/2015  1. Proximal extension of soft tissue emphysema into the anterior aspect of the distal lower leg. 2. No acute or significant osseous findings demonstrated at the ankle. 3. Extensive left foot abnormalities described separately.     Dg Foot Complete Left 03/04/2015   1. Extensive soft tissue emphysema throughout the foot worrisome for soft tissue infection. 2. No definite signs of osteomyelitis. There are extensive neuropathic changes at the Lisfranc joint. 3. Consider MRI for further evaluation.      Code Status: Full Family Communication: Plan of care discussed with the patient  Disposition Plan: Admit for further evaluation  Manson Passey, MD  [facility] Pager [phone]  Review of Systems:  Constitutional: Negative for fever, chills and malaise/fatigue. Negative for diaphoresis.  HENT: Negative for hearing loss, ear pain, nosebleeds, congestion, sore throat, neck pain, tinnitus and ear discharge.   Eyes: Negative for blurred vision, double vision, photophobia, pain, discharge and redness.  Respiratory: Negative for cough, hemoptysis, sputum production, shortness of breath, wheezing and stridor.   Cardiovascular: Negative for chest pain, palpitations, orthopnea, claudication and leg swelling.  Gastrointestinal: Negative for nausea, vomiting and abdominal pain. Negative for heartburn, constipation, blood in stool and melena.  Genitourinary: Negative for dysuria, urgency, frequency, hematuria and flank pain.  Musculoskeletal: Negative for myalgias, back pain. Skin: Negative for itching and rash.  Neurological: Negative for dizziness and weakness.  Endo/Heme/Allergies: Negative for environmental allergies  and  polydipsia. Does not bruise/bleed easily.  Psychiatric/Behavioral: Negative for suicidal ideas. The patient is not nervous/anxious.      Past Medical History  Diagnosis Date  . Diabetes mellitus   . Arthritis   . Kidney disease   . Hypertension    Past Surgical History  Procedure Laterality Date  . Tonsillectomy and adenoidectomy  1942  . Testicle surgery  1982    [facility]   Social History:  reports that he has quit smoking. His smoking use included Pipe. He has never used smokeless tobacco. He reports that he does not drink alcohol or use illicit drugs.  Allergies  Allergen Reactions  . Lipitor [Atorvastatin] Nausea Only    GI Issues.    Family History:  Family History  Problem Relation Age of Onset  . Ovarian cancer Mother   . Diabetes Father   . Early death Neg Hx   . Heart disease Neg Hx   . Hyperlipidemia Neg Hx   . Hypertension Neg Hx   . Kidney disease Neg Hx   . Stroke Neg Hx   . Alcohol abuse Neg Hx   . Arthritis Neg Hx      Prior to Admission medications   Medication Sig Start Date End Date Taking? Authorizing Provider  amLODipine (NORVASC) 10 MG tablet Take 10 mg by mouth daily.   Yes Historical Provider, MD  aspirin 81 MG tablet Take 81 mg by mouth daily.   Yes Historical Provider, MD  CALCIUM PO Take 1 tablet by mouth 2 (two) times daily.   Yes Historical Provider, MD  fish oil-omega-3 fatty acids 1000 MG capsule Take 1 g by mouth daily.   Yes Historical Provider, MD  IRON-FOLIC ACID PO Take 1 tablet by mouth daily.   Yes Historical Provider, MD  Multiple Vitamins-Minerals (CENTRUM SILVER ADULT 50+ PO) Take 1 tablet by mouth daily.   Yes Historical Provider, MD  NON FORMULARY Take 1 each by mouth daily. Macular Degenerative Supplement.   Yes Historical Provider, MD  omeprazole (PRILOSEC) 20 MG capsule Take 1 capsule (20 mg total) by mouth daily as needed. 03/20/13  Yes Etta Grandchild, MD  simvastatin (ZOCOR) 20 MG tablet Take 1 tablet (20 mg total) by  mouth every evening. 03/10/13  Yes Etta Grandchild, MD  lisinopril (PRINIVIL,ZESTRIL) 20 MG tablet take 1 tablet by mouth once daily Patient not taking: Reported on 03/04/2015 06/17/13   Etta Grandchild, MD  zaleplon (SONATA) 5 MG capsule Take 1 capsule (5 mg total) by mouth at bedtime. Take 1-2 at bedtime Patient not taking: Reported on 03/04/2015 03/10/13   Etta Grandchild, MD   Physical Exam: Filed Vitals:   03/04/15 1021 03/04/15 1409 03/04/15 1427 03/04/15 1500  BP: 147/71 117/53 141/74   Pulse: 84 86 84   Temp: 97.5 F (36.4 C)  98.4 F (36.9 C)   TempSrc: Oral  Oral   Resp: Weight:    78.6 kg (173 lb 4.5 oz)  SpO2: 98% 98% 100%     Physical Exam  Constitutional: Appears well-developed and well-nourished. No distress.  HENT: Normocephalic. No tonsillar erythema or exudates Eyes: Conjunctivae and EOM are normal. PERRLA, no scleral icterus.  Neck: Normal ROM. Neck supple. No JVD. No tracheal deviation. No thyromegaly.  CVS: RRR, S1/S2 +, no murmurs, no gallops, no carotid bruit.  Pulmonary: Effort and breath sounds normal, no stridor, rhonchi, wheezes, rales.  Abdominal: Soft. BS +,  no distension, tenderness,  rebound or guarding.  Musculoskeletal: Normal range of motion. Left foot infection, plantar ulcer with purulent drainage.  Lymphadenopathy: No lymphadenopathy noted, cervical, inguinal. Neuro: Alert. No focal neurologic deficits. Skin: Skin is warm and dry. Plantar ulcer with drainage  Psychiatric: Normal mood and affect. Behavior, judgment, thought content normal.   Labs on Admission:  Basic Metabolic Panel:  Recent Labs Lab 03/04/15 1038  NA 136  K 3.2*  CL 102  CO2 21  GLUCOSE 180*  BUN 53*  CREATININE 2.59*  CALCIUM 8.8   Liver Function Tests: No results for input(s): AST, ALT, ALKPHOS, BILITOT, PROT, ALBUMIN in the last 168 hours. No results for input(s): LIPASE, AMYLASE in the last 168 hours. No results for input(s): AMMONIA in the last 168  hours. CBC:  Recent Labs Lab 03/04/15 1038  WBC 13.3*  NEUTROABS 11.2*  HGB 11.5*  HCT 34.9*  MCV 85.1  PLT 338   Cardiac Enzymes: No results for input(s): CKTOTAL, CKMB, CKMBINDEX, TROPONINI in the last 168 hours. BNP: Invalid input(s): POCBNP CBG: No results for input(s): GLUCAP in the last 168 hours.  If 7PM-7AM, please contact night-coverage [email or web address] Password [email or web address] 03/04/2015, 4:09 PM

## 2015-03-04 NOTE — ED Notes (Signed)
No lab draw, pt admitted to floor and is en route

## 2015-03-04 NOTE — Progress Notes (Addendum)
ANTIBIOTIC CONSULT NOTE - INITIAL  Pharmacy Consult for Vancomycin and Zosyn Indication: LE wound infection  Allergies  Allergen Reactions  . Lipitor [Atorvastatin] Nausea Only    GI Issues.   Patient Measurements:   TBW:   Vital Signs: Temp: 97.5 F (36.4 C) (03/31 1021) Temp Source: Oral (03/31 1021) BP: 147/71 mmHg (03/31 1021) Pulse Rate: 84 (03/31 1021) Intake/Output from previous day:   Intake/Output from this shift:    Labs:  Recent Labs  03/04/15 1038  WBC 13.3*  HGB 11.5*  PLT 338  CREATININE 2.59*   CrCl cannot be calculated (Unknown ideal weight.). No results for input(s): VANCOTROUGH, VANCOPEAK, VANCORANDOM, GENTTROUGH, GENTPEAK, GENTRANDOM, TOBRATROUGH, TOBRAPEAK, TOBRARND, AMIKACINPEAK, AMIKACINTROU, AMIKACIN in the last 72 hours.   Microbiology: No results found for this or any previous visit (from the past 720 hour(s)).  Medical History: Past Medical History  Diagnosis Date  . Diabetes mellitus   . Arthritis   . Kidney disease   . Hypertension    Medications:  Scheduled:   Anti-infectives    Start     Dose/Rate Route Frequency Ordered Stop   03/04/15 1200  clindamycin (CLEOCIN) IVPB 600 mg     600 mg 100 mL/hr over 30 Minutes Intravenous  Once 03/04/15 1150 03/04/15 1324   03/04/15 1045  vancomycin (VANCOCIN) IVPB 1000 mg/200 mL premix     1,000 mg 200 mL/hr over 60 Minutes Intravenous  Once 03/04/15 1044     03/04/15 1045  piperacillin-tazobactam (ZOSYN) IVPB 3.375 g     3.375 g 12.5 mL/hr over 240 Minutes Intravenous  Once 03/04/15 1044 03/04/15 1224     Assessment: L foot wound infection noticed by family 3/30, to PCP 3/31 who sent pt to ED, patient states previous foot swelling for "quite some time". Hx of DM, not taking any medication.  Admit SCr 2.59, Hgb A1c ordered  Goal of Therapy:  Vancomycin trough level 15-20 mcg/ml  Plan:    Chilton SiGreen, Terri L 03/04/2015,1:51 PM   Addendum: Weight = 78.6kg, CrCl = 24 ml/min  (normalized)  Plan:  Vancomycin 1g already given, continue with 750mg  IV q24h Check trough at steady state Zosyn 3.375gm IV q8h (4hr extended infusions) Follow up renal function & cultures, clinical course  Loralee PacasErin Ukiah Trawick, PharmD, BCPS Pager: [phone] 03/04/2015 3:25 PM

## 2015-03-04 NOTE — Progress Notes (Signed)
CSW received consult for medication assistance upon discharge. CSW referred to rn cm. Please call for further csw needs. No further Clinical Social Work needs, signing off.   Olga CoasterKristen Cordell Coke, LCSW  Clinical Social Work  [facility] [phone]

## 2015-03-04 NOTE — ED Notes (Signed)
Pt present with wound to bottom of left foot. Pt states does not know how long it has been there, just noticed it last night. Pt went to PCP today and sent here for further evaluation.

## 2015-03-04 NOTE — ED Provider Notes (Signed)
CSN: [id number]     Arrival date & time 03/04/15  1010 History   First MD Initiated Contact with Patient 03/04/15 1016     Chief Complaint  Patient presents with  . Wound Infection     (Consider location/radiation/quality/duration/timing/severity/associated sxs/prior Treatment) HPI Comments: Patient from PCPs office with foot pain and swelling in wound to the bottom of his left foot. He is unable to tell how long it has been there. It was noticed yesterday by his family. He endorses that he's had some swelling in his foot for quite some time. Denies any injury. Denies fever or vomiting. He states he was diabetic in the past but no longer takes medication for it. Denies any chest pain, abdominal pain, shortness of breath, cough or fever.  The history is provided by the patient and a relative.    Past Medical History  Diagnosis Date  . Diabetes mellitus   . Arthritis   . Kidney disease   . Hypertension    Past Surgical History  Procedure Laterality Date  . Tonsillectomy and adenoidectomy  1942  . Testicle surgery  1982    Hyd[facility] Seal   Family History  Problem Relation Age of Onset  . Ovarian cancer Mother   . Diabetes Father   . Early death Neg Hx   . Heart disease Neg Hx   . Hyperlipidemia Neg Hx   . Hypertension Neg Hx   . Kidney disease Neg Hx   . Stroke Neg Hx   . Alcohol abuse Neg Hx   . Arthritis Neg Hx    History  Substance Use Topics  . Smoking status: Former Smoker    Types: Pipe  . Smokeless tobacco: Never Used  . Alcohol Use: No    Review of Systems  Constitutional: Negative for fever, activity change and appetite change.  Eyes: Negative for visual disturbance.  Respiratory: Negative for cough, chest tightness and shortness of breath.   Cardiovascular: Negative for chest pain.  Gastrointestinal: Negative for nausea, vomiting and abdominal pain.  Genitourinary: Negative for dysuria and hematuria.  Musculoskeletal: Negative for myalgias and arthralgias.   Skin: Positive for wound.  Neurological: Negative for dizziness, weakness and headaches.  A complete 10 system review of systems was obtained and all systems are negative except as noted in the HPI and PMH.      Allergies  Lipitor  Home Medications   Prior to Admission medications   Medication Sig Start Date End Date Taking? Authorizing Provider  amLODipine (NORVASC) 10 MG tablet Take 10 mg by mouth daily.   Yes Historical Provider, MD  aspirin 81 MG tablet Take 81 mg by mouth daily.   Yes Historical Provider, MD  CALCIUM PO Take 1 tablet by mouth 2 (two) times daily.   Yes Historical Provider, MD  fish oil-omega-3 fatty acids 1000 MG capsule Take 1 g by mouth daily.   Yes Historical Provider, MD  IRON-FOLIC ACID PO Take 1 tablet by mouth daily.   Yes Historical Provider, MD  Multiple Vitamins-Minerals (CENTRUM SILVER ADULT 50+ PO) Take 1 tablet by mouth daily.   Yes Historical Provider, MD  NON FORMULARY Take 1 each by mouth daily. Macular Degenerative Supplement.   Yes Historical Provider, MD  omeprazole (PRILOSEC) 20 MG capsule Take 1 capsule (20 mg total) by mouth daily as needed. 03/20/13  Yes Etta Grandchild, MD  simvastatin (ZOCOR) 20 MG tablet Take 1 tablet (20 mg total) by mouth every evening. 03/10/13  Yes Etta Grandchild,  MD  lisinopril (PRINIVIL,ZESTRIL) 20 MG tablet take 1 tablet by mouth once daily Patient not taking: Reported on 03/04/2015 06/17/13   Etta Grandchild, MD  zaleplon (SONATA) 5 MG capsule Take 1 capsule (5 mg total) by mouth at bedtime. Take 1-2 at bedtime Patient not taking: Reported on 03/04/2015 03/10/13   Etta Grandchild, MD   BP 141/74 mmHg  Pulse 84  Temp(Src) 98.4 F (36.9 C) (Oral)  Resp 18  Wt 173 lb 4.5 oz (78.6 kg)  SpO2 100% Physical Exam  Constitutional: He is oriented to person, place, and time. He appears well-developed and well-nourished. No distress.  HENT:  Head: Normocephalic and atraumatic.  Mouth/Throat: Oropharynx is clear and moist. No  oropharyngeal exudate.  Eyes: Conjunctivae and EOM are normal. Pupils are equal, round, and reactive to light.  Neck: Normal range of motion. Neck supple.  No meningismus.  Cardiovascular: Normal rate, regular rhythm, normal heart sounds and intact distal pulses.   No murmur heard. Pulmonary/Chest: Effort normal and breath sounds normal. No respiratory distress.  Abdominal: Soft. There is no tenderness. There is no rebound and no guarding.  Musculoskeletal: He exhibits edema and tenderness.  Left foot is diffusely swollen and erythematous. There is crepitance on the dorsal foot. On the lateral aspect of the mid sole there is a 1 cm ulceration draining purulent and clear fluid. Scabbed to left anterior ankle.  Neurological: He is alert and oriented to person, place, and time. No cranial nerve deficit. He exhibits normal muscle tone. Coordination normal.  No ataxia on finger to nose bilaterally. No pronator drift. 5/5 strength throughout. CN 2-12 intact. Negative Romberg. Equal grip strength. Sensation intact. Gait is normal.   Skin: Skin is warm.  Psychiatric: He has a normal mood and affect. His behavior is normal.  Nursing note and vitals reviewed.   ED Course  Procedures (including critical care time) Labs Review Labs Reviewed  CBC WITH DIFFERENTIAL/PLATELET - Abnormal; Notable for the following:    WBC 13.3 (*)    RBC 4.10 (*)    Hemoglobin 11.5 (*)    HCT 34.9 (*)    Neutrophils Relative % 84 (*)    Neutro Abs 11.2 (*)    Lymphocytes Relative 5 (*)    Monocytes Absolute 1.4 (*)    All other components within normal limits  BASIC METABOLIC PANEL - Abnormal; Notable for the following:    Potassium 3.2 (*)    Glucose, Bld 180 (*)    BUN 53 (*)    Creatinine, Ser 2.59 (*)    GFR calc non Af Amer 22 (*)    GFR calc Af Amer 26 (*)    All other components within normal limits  CULTURE, BLOOD (ROUTINE X 2)  CULTURE, BLOOD (ROUTINE X 2)  WOUND CULTURE  HIV ANTIBODY (ROUTINE  TESTING)  SEDIMENTATION RATE  C-REACTIVE PROTEIN  PREALBUMIN  TSH  HEMOGLOBIN A1C  COMPREHENSIVE METABOLIC PANEL  MAGNESIUM  PHOSPHORUS  I-STAT CG4 LACTIC ACID, ED  I-STAT CG4 LACTIC ACID, ED    Imaging Review Dg Ankle Complete Left  03/04/2015   CLINICAL DATA:  Wound infection. History of diabetes, kidney disease and arthritis. Initial encounter.  EXAM: LEFT ANKLE COMPLETE - 3+ VIEW  COMPARISON:  None.  FINDINGS: At the ankle, there is no evidence of acute fracture, dislocation, bone destruction or significant arthropathy. Extensive abnormalities within the foot are described separately. There is soft tissue emphysema throughout the foot with proximal extension anteriorly into the distal lower  leg. No foreign bodies identified.  IMPRESSION: 1. Proximal extension of soft tissue emphysema into the anterior aspect of the distal lower leg. 2. No acute or significant osseous findings demonstrated at the ankle. 3. Extensive left foot abnormalities described separately.   Electronically Signed   By: Carey BullocksWilliam  Veazey M.D.   On: 03/04/2015 11:20   Dg Foot Complete Left  03/04/2015   CLINICAL DATA:  Wound infection. History of diabetes, kidney disease and arthritis. Initial encounter.  EXAM: LEFT FOOT - COMPLETE 3+ VIEW  COMPARISON:  None.  FINDINGS: The bones are diffusely demineralized. There is extensive abnormality at the Lisfranc joint with fragmentation of the metatarsal bases and cuneiform bones and dorsal lateral subluxation. These findings are consistent with an underlying neuropathic joint. The talus, calcaneus, cuboid and navicular are intact. There is extensive soft tissue emphysema throughout the foot with associated soft tissue swelling, worrisome for superimposed infection. No foreign bodies identified.  IMPRESSION: 1. Extensive soft tissue emphysema throughout the foot worrisome for soft tissue infection. 2. No definite signs of osteomyelitis. There are extensive neuropathic changes at the  Lisfranc joint. 3. Consider MRI for further evaluation.   Electronically Signed   By: Carey BullocksWilliam  Veazey M.D.   On: 03/04/2015 11:23     EKG Interpretation None      MDM   Final diagnoses:  Necrotizing fasciitis  Diabetic foot infection   Diabetic foot infection with concern for possible osteomyelitis and abscess. Vital stable. Afebrile.  Labs, x-ray, cultures, IV antibiotics. Leukocytosis noted.  CKD at baseline.  X-ray shows soft tissue emphysema concerning for necrotizing fasciitis. Patient started on broad-spectrum antibiotics. Difficulty contacting Dr. August Saucerean. Multiple pages over 1 hour with no response. His cell phone go straight to voicemail.  Contacted Dr. August Saucerean through [facility].  He will consult urgently and requests medical admission. Vitals remained stable in the ED.   CRITICAL CARE Performed by: Glynn OctaveANCOUR, Grason Brailsford Total critical care time: 45 Critical care time was exclusive of separately billable procedures and treating other patients. Critical care was necessary to treat or prevent imminent or life-threatening deterioration. Critical care was time spent personally by me on the following activities: development of treatment plan with patient and/or surrogate as well as nursing, discussions with consultants, evaluation of patient's response to treatment, examination of patient, obtaining history from patient or surrogate, ordering and performing treatments and interventions, ordering and review of laboratory studies, ordering and review of radiographic studies, pulse oximetry and re-evaluation of patient's condition.     Glynn OctaveStephen Zong Mcquarrie, MD 03/04/15 [phone]

## 2015-03-04 NOTE — Anesthesia Preprocedure Evaluation (Signed)
Anesthesia Evaluation  Patient identified by MRN, date of birth, ID band Patient awake    Reviewed: Allergy & Precautions, NPO status , Patient's Chart, lab work & pertinent test results  History of Anesthesia Complications Negative for: history of anesthetic complications  Airway Mallampati: II  TM Distance: >3 FB Neck ROM: Full    Dental no notable dental hx. (+) Dental Advisory Given, Edentulous Upper, Edentulous Lower, Poor Dentition   Pulmonary former smoker,  breath sounds clear to auscultation  Pulmonary exam normal       Cardiovascular hypertension, Pt. on medications Rhythm:Regular Rate:Normal     Neuro/Psych negative neurological ROS  negative psych ROS   GI/Hepatic negative GI ROS, Neg liver ROS,   Endo/Other  negative endocrine ROSdiabetes  Renal/GU Renal InsufficiencyRenal disease  negative genitourinary   Musculoskeletal  (+) Arthritis -,   Abdominal   Peds negative pediatric ROS (+)  Hematology  (+) anemia ,   Anesthesia Other Findings   Reproductive/Obstetrics negative OB ROS                             Anesthesia Physical Anesthesia Plan  ASA: III and emergent  Anesthesia Plan: General   Post-op Pain Management:    Induction: Intravenous, Rapid sequence and Cricoid pressure planned  Airway Management Planned: Oral ETT  Additional Equipment:   Intra-op Plan:   Post-operative Plan: Extubation in OR  Informed Consent: I have reviewed the patients History and Physical, chart, labs and discussed the procedure including the risks, benefits and alternatives for the proposed anesthesia with the patient or authorized representative who has indicated his/her understanding and acceptance.   Dental advisory given  Plan Discussed with: CRNA  Anesthesia Plan Comments:         Anesthesia Quick Evaluation

## 2015-03-04 NOTE — ED Notes (Signed)
eft lower extremity swelling with redness and warmth. Crusted wound to top of foot and open unstagable wound to bottom of left foot with purlent discharge.

## 2015-03-04 NOTE — Progress Notes (Signed)
This pt inquired about medication assistance.  This pt is a medicare covered pt therefore there is not a [facility] medication assistance program available

## 2015-03-04 NOTE — Progress Notes (Signed)
MRI scans reviewed. Extensive gas throughout all soft tissues in the foot up to the ankle.There is no focal abscess fluid collection which is drainable. The extent of the soft tissue infection is significant and involved the entire foot and ankle.More gas in the soft tissue is present and with typical infections. For this reason I don't think there is any way to selectively debride for open up any type of infection region I think necrotizing fasciitis is also a possibility in this particular circumstance. For this reason below knee amputation is indicated. The risk and benefits are discussed with the patient including but limited to persistent infection need for more surgery wound dehiscence of an other perioperative complications. Patient understands the risk and benefits and wishes to proceed. No other viable options are really present with the amount of soft tissue crepitus present up to the ankle as well as the extent of the infection in the light of significant Charcot neuropathy and instability of the tarsometatarsal joints on physical exam. Crepitus is present both on the dorsal and plantar aspect of the foot.  Plan is for below-knee amputation tonight

## 2015-03-04 NOTE — Consult Note (Signed)
Reason for Consult: Left foot infection Referring Physician: Dr. [name] is an 81 y.o. male.  HPI: Steve Lowery is a 81 year old patient with several month history of left foot deformity. He's had 2 days of chills a day ago but has not had any today. He noted that he had a "hole in his foot" and EMS was called to transport. In the emergency room he was noted have a white count 13,000 stable blood pressure. Lactic acid is within normal limits however he does have soft tissue crepitus on the dorsal aspect of his foot along with a plantar ulcer which is draining purulent fluid. He has been given vancomycin and Cleocin. He denies any fevers chills or malaise. Generally he states that his foot is not painful.  Past Medical History  Diagnosis Date  . Diabetes mellitus   . Arthritis   . Kidney disease   . Hypertension     Past Surgical History  Procedure Laterality Date  . Tonsillectomy and adenoidectomy  1942  . Testicle surgery  1982    Hydra Seal    Family History  Problem Relation Age of Onset  . Ovarian cancer Mother   . Diabetes Father   . Early death Neg Hx   . Heart disease Neg Hx   . Hyperlipidemia Neg Hx   . Hypertension Neg Hx   . Kidney disease Neg Hx   . Stroke Neg Hx   . Alcohol abuse Neg Hx   . Arthritis Neg Hx     Social History:  reports that he has quit smoking. His smoking use included Pipe. He has never used smokeless tobacco. He reports that he does not drink alcohol or use illicit drugs.  Allergies:  Allergies  Allergen Reactions  . Lipitor [Atorvastatin] Nausea Only    GI Issues.    Medications: I have reviewed the patient's current medications.  Results for orders placed or performed during the hospital encounter of 03/04/15 (from the past 48 hour(s))  CBC with Differential/Platelet     Status: Abnormal   Collection Time: 03/04/15 10:38 AM  Result Value Ref Range   WBC 13.3 (H) 4.0 - 10.5 K/uL   RBC 4.10 (L) 4.22 - 5.81 MIL/uL   Hemoglobin  11.5 (L) 13.0 - 17.0 g/dL   HCT 34.9 (L) 39.0 - 52.0 %   MCV 85.1 78.0 - 100.0 fL   MCH 28.0 26.0 - 34.0 pg   MCHC 33.0 30.0 - 36.0 g/dL   RDW 13.6 11.5 - 15.5 %   Platelets 338 150 - 400 K/uL   Neutrophils Relative % 84 (H) 43 - 77 %   Neutro Abs 11.2 (H) 1.7 - 7.7 K/uL   Lymphocytes Relative 5 (L) 12 - 46 %   Lymphs Abs 0.7 0.7 - 4.0 K/uL   Monocytes Relative 10 3 - 12 %   Monocytes Absolute 1.4 (H) 0.1 - 1.0 K/uL   Eosinophils Relative 1 0 - 5 %   Eosinophils Absolute 0.1 0.0 - 0.7 K/uL   Basophils Relative 0 0 - 1 %   Basophils Absolute 0.0 0.0 - 0.1 K/uL  Basic metabolic panel     Status: Abnormal   Collection Time: 03/04/15 10:38 AM  Result Value Ref Range   Sodium 136 135 - 145 mmol/L   Potassium 3.2 (L) 3.5 - 5.1 mmol/L   Chloride 102 96 - 112 mmol/L   CO2 21 19 - 32 mmol/L   Glucose, Bld 180 (H) 70 - 99 mg/dL  BUN 53 (H) 6 - 23 mg/dL   Creatinine, Ser 2.59 (H) 0.50 - 1.35 mg/dL   Calcium 8.8 8.4 - 10.5 mg/dL   GFR calc non Af Amer 22 (L) >90 mL/min   GFR calc Af Amer 26 (L) >90 mL/min    Comment: (NOTE) The eGFR has been calculated using the CKD EPI equation. This calculation has not been validated in all clinical situations. eGFR's persistently <90 mL/min signify possible Chronic Kidney Disease.    Anion gap 13 5 - 15  I-Stat CG4 Lactic Acid, ED     Status: None   Collection Time: 03/04/15 10:49 AM  Result Value Ref Range   Lactic Acid, Venous 1.45 0.5 - 2.0 mmol/L  I-Stat CG4 Lactic Acid, ED     Status: None   Collection Time: 03/04/15  1:57 PM  Result Value Ref Range   Lactic Acid, Venous 1.33 0.5 - 2.0 mmol/L    Dg Ankle Complete Left  03/04/2015   CLINICAL DATA:  Wound infection. History of diabetes, kidney disease and arthritis. Initial encounter.  EXAM: LEFT ANKLE COMPLETE - 3+ VIEW  COMPARISON:  None.  FINDINGS: At the ankle, there is no evidence of acute fracture, dislocation, bone destruction or significant arthropathy. Extensive abnormalities  within the foot are described separately. There is soft tissue emphysema throughout the foot with proximal extension anteriorly into the distal lower leg. No foreign bodies identified.  IMPRESSION: 1. Proximal extension of soft tissue emphysema into the anterior aspect of the distal lower leg. 2. No acute or significant osseous findings demonstrated at the ankle. 3. Extensive left foot abnormalities described separately.   Electronically Signed   By: Richardean Sale M.D.   On: 03/04/2015 11:20   Dg Foot Complete Left  03/04/2015   CLINICAL DATA:  Wound infection. History of diabetes, kidney disease and arthritis. Initial encounter.  EXAM: LEFT FOOT - COMPLETE 3+ VIEW  COMPARISON:  None.  FINDINGS: The bones are diffusely demineralized. There is extensive abnormality at the Lisfranc joint with fragmentation of the metatarsal bases and cuneiform bones and dorsal lateral subluxation. These findings are consistent with an underlying neuropathic joint. The talus, calcaneus, cuboid and navicular are intact. There is extensive soft tissue emphysema throughout the foot with associated soft tissue swelling, worrisome for superimposed infection. No foreign bodies identified.  IMPRESSION: 1. Extensive soft tissue emphysema throughout the foot worrisome for soft tissue infection. 2. No definite signs of osteomyelitis. There are extensive neuropathic changes at the Lisfranc joint. 3. Consider MRI for further evaluation.   Electronically Signed   By: Richardean Sale M.D.   On: 03/04/2015 11:23    Review of Systems  Constitutional: Positive for chills.  HENT: Negative.   Eyes: Negative.   Respiratory: Negative.   Cardiovascular: Negative.   Gastrointestinal: Negative.   Genitourinary: Negative.   Musculoskeletal: Negative.   Skin: Negative.   Neurological: Negative.   Endo/Heme/Allergies: Negative.   Psychiatric/Behavioral: Negative.    Blood pressure 141/74, pulse 84, temperature 98.4 F (36.9 C), temperature  source Oral, resp. rate 18, SpO2 100 %. Physical Exam  Constitutional: He appears well-developed.  HENT:  Head: Normocephalic.  Eyes: Pupils are equal, round, and reactive to light.  Neck: Normal range of motion.  Cardiovascular: Normal rate.   Respiratory: Effort normal.  Neurological: He is alert.  Skin: Skin is warm.  Psychiatric: He has a normal mood and affect.   left foot is examined. He has changes in the foot consistent with Charcot neuropathy. He  has a draining ulcer on the plantar surface of the foot. His foot is perfused and he does have some sensation. Patient states however that he did do multiple pricks on his foot medial aspect in order to "drain fluid". I advised and this was not a great idea. He has soft tissue crepitus on the dorsal aspect of his foot in plantar aspect of the foot. Midfoot has changes consistent with unstable Lisfranc joint and neuropathic joint. There is no crepitus proximally within the ankle or above. He has good ankle dorsi flexion plantar flexion strength.  Assessment/Plan: Impression is superinfection of Charcot joint. Whether this represents necrotizing fasciitis is difficult to say. Nonetheless I do think he has closed space infection with a gas producing organisms. He is not septic appearing yet. I would favor MRI scan of ankle and foot this afternoon in order to fully delineate location of any abscess. He will require surgery tonight of some type. Whether or not it's debridement or amputation remains to be seen. He is extremely resistant to the idea of amputation at this time. However I think that there is a high likelihood that based on the current clinical situation that that could be the final outcome. Plan for MRI this afternoon nothing by mouth no blood thinners.  Pierrette Scheu SCOTT 03/04/2015, 2:38 PM

## 2015-03-04 NOTE — Progress Notes (Signed)
UR completed 

## 2015-03-04 NOTE — Progress Notes (Signed)
VASCULAR LAB PRELIMINARY  ARTERIAL  ABI completed:ABIs indicate adequate blood flow to the bilateral lower extremities.  Great toe pressures are WNL.    RIGHT    LEFT    PRESSURE WAVEFORM  PRESSURE WAVEFORM  BRACHIAL Arm bandaged  BRACHIAL 132 T  DP   DP    AT 156 B AT 162 M  PT 187 B PT 154 M  PER   PER    GREAT TOE 187 NA GREAT TOE 91 NA    RIGHT LEFT  ABI >1.0 >1.0     Labria Wos, RVT 03/04/2015, 4:01 PM

## 2015-03-04 NOTE — Anesthesia Procedure Notes (Addendum)
Procedure Name: Intubation Date/Time: 03/04/2015 10:27 PM Performed by: Paulla DollyJOYCE, Adriane Gabbert A Pre-anesthesia Checklist: Patient identified, Timeout performed, Emergency Drugs available, Suction available and Patient being monitored Patient Re-evaluated:Patient Re-evaluated prior to inductionOxygen Delivery Method: Circle system utilized Preoxygenation: Pre-oxygenation with 100% oxygen Intubation Type: IV induction Ventilation: Mask ventilation without difficulty Laryngoscope Size: Mac and 4 Grade View: Grade I Tube type: Oral Tube size: 7.5 mm Number of attempts: 1 Airway Equipment and Method: Stylet Placement Confirmation: ETT inserted through vocal cords under direct vision,  breath sounds checked- equal and bilateral and positive ETCO2 Secured at: 21 cm Tube secured with: Tape Dental Injury: Teeth and Oropharynx as per pre-operative assessment

## 2015-03-05 ENCOUNTER — Encounter (HOSPITAL_COMMUNITY): Payer: Self-pay | Admitting: Orthopedic Surgery

## 2015-03-05 DIAGNOSIS — L089 Local infection of the skin and subcutaneous tissue, unspecified: Secondary | ICD-10-CM

## 2015-03-05 DIAGNOSIS — E11628 Type 2 diabetes mellitus with other skin complications: Secondary | ICD-10-CM | POA: Diagnosis present

## 2015-03-05 DIAGNOSIS — M009 Pyogenic arthritis, unspecified: Secondary | ICD-10-CM

## 2015-03-05 LAB — ABO/RH: ABO/RH(D): AB POS

## 2015-03-05 LAB — HIV ANTIBODY (ROUTINE TESTING W REFLEX): HIV SCREEN 4TH GENERATION: NONREACTIVE

## 2015-03-05 LAB — GLUCOSE, CAPILLARY
GLUCOSE-CAPILLARY: 118 mg/dL — AB (ref 70–99)
GLUCOSE-CAPILLARY: 115 mg/dL — AB (ref 70–99)
Glucose-Capillary: 116 mg/dL — ABNORMAL HIGH (ref 70–99)
Glucose-Capillary: 126 mg/dL — ABNORMAL HIGH (ref 70–99)
Glucose-Capillary: 170 mg/dL — ABNORMAL HIGH (ref 70–99)

## 2015-03-05 LAB — HEMOGLOBIN A1C
HEMOGLOBIN A1C: 6.7 % — AB (ref 4.8–5.6)
Hgb A1c MFr Bld: 6.6 % — ABNORMAL HIGH (ref 4.8–5.6)
Mean Plasma Glucose: 146 mg/dL
Mean Plasma Glucose: 143 mg/dL

## 2015-03-05 LAB — CBC
HCT: 33.3 % — ABNORMAL LOW (ref 39.0–52.0)
Hemoglobin: 10.6 g/dL — ABNORMAL LOW (ref 13.0–17.0)
MCH: 27.3 pg (ref 26.0–34.0)
MCHC: 31.8 g/dL (ref 30.0–36.0)
MCV: 85.8 fL (ref 78.0–100.0)
PLATELETS: 315 10*3/uL (ref 150–400)
RBC: 3.88 MIL/uL — ABNORMAL LOW (ref 4.22–5.81)
RDW: 13.8 % (ref 11.5–15.5)
WBC: 10.4 10*3/uL (ref 4.0–10.5)

## 2015-03-05 LAB — COMPREHENSIVE METABOLIC PANEL
ALT: 16 U/L (ref 0–53)
ANION GAP: 11 (ref 5–15)
AST: 25 U/L (ref 0–37)
Albumin: 2.1 g/dL — ABNORMAL LOW (ref 3.5–5.2)
Alkaline Phosphatase: 61 U/L (ref 39–117)
BUN: 46 mg/dL — AB (ref 6–23)
CO2: 17 mmol/L — ABNORMAL LOW (ref 19–32)
Calcium: 7.8 mg/dL — ABNORMAL LOW (ref 8.4–10.5)
Chloride: 110 mmol/L (ref 96–112)
Creatinine, Ser: 2.29 mg/dL — ABNORMAL HIGH (ref 0.50–1.35)
GFR calc non Af Amer: 26 mL/min — ABNORMAL LOW (ref >90)
GFR, EST AFRICAN AMERICAN: 30 mL/min — AB (ref >90)
GLUCOSE: 143 mg/dL — AB (ref 70–99)
Potassium: 3.6 mmol/L (ref 3.5–5.1)
Sodium: 138 mmol/L (ref 135–145)
TOTAL PROTEIN: 5.5 g/dL — AB (ref 6.0–8.3)
Total Bilirubin: 1 mg/dL (ref 0.3–1.2)

## 2015-03-05 LAB — PROTIME-INR
INR: 1.28 (ref 0.00–1.49)
Prothrombin Time: 16.2 seconds — ABNORMAL HIGH (ref 11.6–15.2)

## 2015-03-05 LAB — APTT: aPTT: 38 seconds — ABNORMAL HIGH (ref 24–37)

## 2015-03-05 MED ORDER — HYDROMORPHONE HCL 1 MG/ML IJ SOLN: 0.2500 mg | INTRAMUSCULAR | Status: DC | PRN

## 2015-03-05 MED ORDER — ENOXAPARIN SODIUM 30 MG/0.3ML ~~LOC~~ SOLN
30.0000 mg | SUBCUTANEOUS | Status: DC
  Administered 2015-03-05 – 2015-03-07 (×3): 30 mg via SUBCUTANEOUS
  Filled 2015-03-05 (×4): qty 0.3

## 2015-03-05 MED ORDER — ACETAMINOPHEN 325 MG PO TABS: 650.0000 mg | ORAL_TABLET | Freq: Four times a day (QID) | ORAL | Status: DC | PRN

## 2015-03-05 MED ORDER — ONDANSETRON HCL 4 MG PO TABS: 4.0000 mg | ORAL_TABLET | Freq: Four times a day (QID) | ORAL | Status: DC | PRN

## 2015-03-05 MED ORDER — PRO-STAT SUGAR FREE PO LIQD
30.0000 mL | Freq: Two times a day (BID) | ORAL | Status: DC
  Filled 2015-03-05 (×8): qty 30

## 2015-03-05 MED ORDER — ONDANSETRON HCL 4 MG/2ML IJ SOLN: 4.0000 mg | Freq: Once | INTRAMUSCULAR | Status: DC | PRN

## 2015-03-05 MED ORDER — ONDANSETRON HCL 4 MG/2ML IJ SOLN: 4.0000 mg | Freq: Four times a day (QID) | INTRAMUSCULAR | Status: DC | PRN

## 2015-03-05 MED ORDER — LACTATED RINGERS IV SOLN: INTRAVENOUS | Status: DC

## 2015-03-05 MED ORDER — METOCLOPRAMIDE HCL 10 MG PO TABS
5.0000 mg | ORAL_TABLET | Freq: Three times a day (TID) | ORAL | Status: DC | PRN
Start: 2015-03-05 — End: 2015-03-08

## 2015-03-05 MED ORDER — ACETAMINOPHEN 650 MG RE SUPP: 650.0000 mg | Freq: Four times a day (QID) | RECTAL | Status: DC | PRN

## 2015-03-05 MED ORDER — ENOXAPARIN SODIUM 30 MG/0.3ML ~~LOC~~ SOLN: 30.0000 mg | SUBCUTANEOUS | Status: DC

## 2015-03-05 MED ORDER — METOCLOPRAMIDE HCL 5 MG/ML IJ SOLN: 5.0000 mg | Freq: Three times a day (TID) | INTRAMUSCULAR | Status: DC | PRN

## 2015-03-05 MED ORDER — HYDROCODONE-ACETAMINOPHEN 10-325 MG PO TABS
1.0000 | ORAL_TABLET | ORAL | Status: DC | PRN
  Administered 2015-03-05 – 2015-03-07 (×4): 2 via ORAL
  Filled 2015-03-05 (×4): qty 2

## 2015-03-05 NOTE — Progress Notes (Signed)
Physical Therapy Treatment Patient Details Name: Steve KocherMichael Lowery MRN: [id number] DOB: [birth year] Today's Date: 03/05/2015    History of Present Illness L BKA s/p necrotizing fasciitis    PT Comments    Pt extremely motivated.  Reinforced need to maintain knee ext on L for future prosthetic fit.  Pt indicates understanding.  Follow Up Recommendations  SNF     Equipment Recommendations  None recommended by PT    Recommendations for Other Services OT consult     Precautions / Restrictions Precautions Precautions: Fall Restrictions Weight Bearing Restrictions: No    Mobility  Bed Mobility Overal bed mobility: Needs Assistance Bed Mobility: Sit to Supine       Sit to supine: Min assist   General bed mobility comments: cues for sequence with min assist to guard new limb  Transfers Overall transfer level: Needs assistance Equipment used: Rolling walker (2 wheeled) Transfers: Sit to/from Stand Sit to Stand: Mod assist;+2 physical assistance;+2 safety/equipment         General transfer comment: cues for LE management and use of UES to self assist; Physical assist to bring wt up and fwd and to initially balance  Ambulation/Gait Ambulation/Gait assistance: Min assist;Mod assist;+2 physical assistance Ambulation Distance (Feet): 18 Feet Assistive device: Rolling walker (2 wheeled) Gait Pattern/deviations: Step-to pattern;Decreased step length - right;Decreased step length - left;Shuffle;Trunk flexed Gait velocity: decr   General Gait Details: cues for posture, sequence, position from RW and UE WB   Stairs            Wheelchair Mobility    Modified Rankin (Stroke Patients Only)       Balance                                    Cognition Arousal/Alertness: Awake/alert Behavior During Therapy: WFL for tasks assessed/performed Overall Cognitive Status: Within Functional Limits for tasks assessed                      Exercises  Amputee Exercises Quad Sets: AROM;Both;10 reps;Supine    General Comments        Pertinent Vitals/Pain Pain Assessment: 0-10 Pain Score: 4  Pain Location: L residual limb Pain Descriptors / Indicators: Aching;Sore Pain Intervention(s): Limited activity within patient's tolerance;Monitored during session;Premedicated before session    Home Living                      Prior Function            PT Goals (current goals can now be found in the care plan section) Acute Rehab PT Goals Patient Stated Goal[facility] and return to IND PT Goal Formulation: With patient Time For Goal Achievement: 03/19/15 Potential to Achieve Goals: Good Progress towards PT goals: Progressing toward goals    Frequency  Min 5X/week    PT Plan Current plan remains appropriate    Co-evaluation             End of Session Equipment Utilized During Treatment: Gait belt Activity Tolerance: Patient tolerated treatment well Patient left: in bed;with call bell/phone within reach     Time: 1532-1550 PT Time Calculation (min) (ACUTE ONLY): 18 min  Charges:  $Gait Training: 8-22 mins                    G Codes:      Steve Lowery 03/05/2015, 6:05 PM

## 2015-03-05 NOTE — Progress Notes (Signed)
Pt only voided 100cc  since surgery. Bladder scan showed 735cc. MD notified. Order for in and out cath given. 2 RN unsuccessful. MD notified. Order given to leave in a coude catheter. Urology floor notifed. RN from urology inserted coude catheter successfully.  Steve BertholdLemons, Dempsey Knotek King

## 2015-03-05 NOTE — Brief Op Note (Addendum)
03/04/2015 - 03/05/2015  12:31 AM  PATIENT:  Steve Lowery  81 y.o. male  PRE-OPERATIVE DIAGNOSIS:  necrotizing fasciitis left foot  POST-OPERATIVE DIAGNOSIS:  necrotizing fasciitis left foot  PROCEDURE:  Procedure(s): AMPUTATION BELOW KNEE  SURGEON:  Surgeon(s): Cammy CopaScott Elivia Robotham, MD  ASSISTANT: none  ANESTHESIA:   general  EBL: 25 ml    Total I/O In: 1100 [I.V.:1100] Out: 0   BLOOD ADMINISTERED: none  DRAINS: none   LOCAL MEDICATIONS USED:  none  SPECIMEN:  Cultures x 1 obtained  COUNTS:  YES  TOURNIQUET:   Total Tourniquet Time Documented: Thigh (Left) - 48 minutes Total: Thigh (Left) - 48 minutes   DICTATION: .Other Dictation: Dictation Number [id number]  PLAN OF CARE: Admit to inpatient   PATIENT DISPOSITION:  PACU - hemodynamically stable

## 2015-03-05 NOTE — Anesthesia Postprocedure Evaluation (Signed)
  Anesthesia Post-op Note  Patient: Rene KocherMichael Schriefer  Procedure(s) Performed: Procedure(s) (LRB): AMPUTATION BELOW KNEE (Left)  Patient Location: PACU  Anesthesia Type: General  Level of Consciousness: awake and alert   Airway and Oxygen Therapy: Patient Spontanous Breathing  Post-op Pain: mild  Post-op Assessment: Post-op Vital signs reviewed, Patient's Cardiovascular Status Stable, Respiratory Function Stable, Patent Airway and No signs of Nausea or vomiting  Last Vitals:  Filed Vitals:   03/05/15 0958  BP: 131/61  Pulse: 83  Temp: 36.6 C  Resp: 18    Post-op Vital Signs: stable   Complications: No apparent anesthesia complications

## 2015-03-05 NOTE — Progress Notes (Signed)
Clinical Social Work Department BRIEF PSYCHOSOCIAL ASSESSMENT 03/05/2015  Patient:  Steve Lowery,Steve Lowery     Account Number:  [id number]     Admit date:  03/04/2015  Clinical Social Worker:  Lacie Scotts  Date/Time:  03/05/2015 10:39 AM  Referred by:  Physician  Date Referred:  03/04/2015  Other Referral:   Interview type:  Patient Other interview type:    PSYCHOSOCIAL DATA Living Status:  ALONE Admitted from facility:   Level of care:   Primary support name:  Steve Lowery Primary support relationship to patient:  CHILD, ADULT Degree of support available:   supportive    CURRENT CONCERNS  Other Concerns:    SOCIAL WORK ASSESSMENT / PLAN Pt is a 81 yr old gentleman living at home prior to hospitalization. CSW met with pt to assist with d/c planning. Pt had a Left BKA on 3/31. ST Rehab will be needed following hospital d/c. Pt is requesting [name] for rehab. SNF has been contacted and clinicals sent for review. A decision is pending. CSW will continue to follow to assist with d/c planning to SNF.   Assessment/plan status:  Psychosocial Support/Ongoing Assessment of Needs Other assessment/ plan:   Information/referral to community resources:   Insurance coverage for SNF and ambulance transport reviewed.    PATIENT'S/FAMILY'S RESPONSE TO PLAN OF CARE: " I wasn't expecting this. I'm ready to start working with therapy. I have a lot of work to do. " Pt's mood is calm and appears motivated though has experienced a great loss. His daughter is bringing his spouse in for a visit today ( from [name] ). Spouse is unaware of his surgery. During csw visit pt chose to focus on the things that he can control ( participating in  his therapy, planning his rehab, telling spouse, himself about his surgery ). Pt will contact csw to speak with family . CSW will remain available for ongoing support and assistance with d/c planning.    Steve Lean LCSW [phone]

## 2015-03-05 NOTE — Progress Notes (Signed)
Inpatient Diabetes Program Recommendations  AACE/ADA: New Consensus Statement on Inpatient Glycemic Control (2013)  Target Ranges:  Prepandial:   less than 140 mg/dL      Peak postprandial:   less than 180 mg/dL (1-2 hours)      Critically ill patients:  140 - 180 mg/dL   Reason for Visit: Diabetes Consult  Diabetes history: DM2 Outpatient Diabetes medications: None Current orders for Inpatient glycemic control: Novolog sensitive tidwc    81 year old male with past medical history of hypertension, dyslipidemia who [facility].  Results for Rene KocherPPEL, Tuff (MRN [id number]) as of 03/05/2015 11:18  Ref. Range 03/04/2015 17:33  Hemoglobin A1C Latest Range: 4.8-5.6 % 6.6 (H)  Results for Rene KocherPPEL, Yahia (MRN [id number]) as of 03/05/2015 11:18  Ref. Range 03/04/2015 17:00 03/04/2015 21:20 03/05/2015 00:48 03/05/2015 08:10  Glucose-Capillary Latest Range: 70-99 mg/dL 284131 (H) 132116 (H) 440126 (H) 118 (H)   Good glycemic control prior to admission. To be discharged to SNF.  Agree with orders.  Will follow while inpatient. Thank you. Ailene Ardshonda Tarynn Garling, RD, LDN, CDE Inpatient Diabetes Coordinator [phone]

## 2015-03-05 NOTE — Transfer of Care (Signed)
Immediate Anesthesia Transfer of Care Note  Patient: Steve KocherMichael Lowery  Procedure(s) Performed: Procedure(s): AMPUTATION BELOW KNEE (Left)  Patient Location: PACU  Anesthesia Type:General  Level of Consciousness: awake, sedated and patient cooperative  Airway & Oxygen Therapy: Patient Spontanous Breathing and Patient connected to face mask oxygen  Post-op Assessment: Report given to RN and Post -op Vital signs reviewed and stable  Post vital signs: Reviewed and stable  Last Vitals:  Filed Vitals:   03/04/15 2117  BP: 122/61  Pulse: 91  Temp: 37.7 C  Resp: 18    Complications: No apparent anesthesia complications

## 2015-03-05 NOTE — Progress Notes (Signed)
CSW assisting with d/c planning. Met with pt and daughters to address questions regarding ST Rehab. [facility] would like to accept pt but has no openings at this time. CSW has been instructed to contact admissions on Monday to check availability. SNF search initiated to provide additional options to pt / family. CSW has provided emotional support to pt / family related to new dx. CSW cell # provided to pt/family. CSW will continue to follow to provide ongoing support and assist with d/c planning.  Werner Lean LCSW [phone]

## 2015-03-05 NOTE — Evaluation (Signed)
Physical Therapy Evaluation Patient Details Name: Steve Lowery MRN: [id number] DOB: 18-Aug-1934 Today's Date: 03/05/2015   History of Present Illness  L BKA s/p necrotizing fasciitis  Clinical Impression  Pt presenting with functional mobility limitations 2* L BKA and post op pain.  Pt very motivated but currently requiring assist of 2 for safe performance of basic mobility tasks.  Pt would greatly benefit from follow up rehab at SNF level to maximize IND and safety prior to return home with ltd assist.  Follow Up Recommendations SNF    Equipment Recommendations  None recommended by PT    Recommendations for Other Services OT consult     Precautions / Restrictions Precautions Precautions: Fall Restrictions Weight Bearing Restrictions: No      Mobility  Bed Mobility Overal bed mobility: Needs Assistance Bed Mobility: Supine to Sit     Supine to sit: Min assist     General bed mobility comments: cues for sequence with min assist to guard new limb  Transfers Overall transfer level: Needs assistance Equipment used: Rolling walker (2 wheeled) Transfers: Sit to/from Stand Sit to Stand: Mod assist;+2 physical assistance;+2 safety/equipment         General transfer comment: cues for LE management and use of UES to self assist  Ambulation/Gait Ambulation/Gait assistance: Min assist;Mod assist;+2 physical assistance;+2 safety/equipment Ambulation Distance (Feet): 18 Feet Assistive device: Rolling walker (2 wheeled) Gait Pattern/deviations: Step-to pattern;Decreased step length - right;Decreased step length - left;Shuffle;Trunk flexed Gait velocity: decr   General Gait Details: cues for posture, sequence, position from RW and UE WB  Stairs            Wheelchair Mobility    Modified Rankin (Stroke Patients Only)       Balance                                             Pertinent Vitals/Pain Pain Assessment: 0-10 Pain Score: 4  Pain  Location: L residual limb Pain Descriptors / Indicators: Sore Pain Intervention(s): Limited activity within patient's tolerance;Monitored during session;Premedicated before session    Home Living Family/patient expects to be discharged to:: Skilled nursing facility Living Arrangements: Alone               Additional Comments: Hopes for dc to [facility] where wife is resident    Prior Function Level of Independence: Independent               Hand Dominance        Extremity/Trunk Assessment   Upper Extremity Assessment: Overall WFL for tasks assessed           Lower Extremity Assessment: LLE deficits/detail   LLE Deficits / Details: New BKA  Cervical / Trunk Assessment: Normal  Communication   Communication: No difficulties  Cognition Arousal/Alertness: Awake/alert Behavior During Therapy: WFL for tasks assessed/performed Overall Cognitive Status: Within Functional Limits for tasks assessed                      General Comments      Exercises Amputee Exercises Quad Sets: AROM;Both;10 reps;Supine      Assessment/Plan    PT Assessment Patient needs continued PT services  PT Diagnosis Difficulty walking   PT Problem List Decreased range of motion;Decreased activity tolerance;Decreased mobility;Decreased balance;Decreased knowledge of use of DME;Pain;Decreased knowledge of precautions  PT Treatment Interventions DME instruction;Gait training;Functional  mobility training;Therapeutic activities;Therapeutic exercise;Patient/family education   PT Goals (Current goals can be found in the Care Plan section) Acute Rehab PT Goals Patient Stated Goal[facility] and return to IND PT Goal Formulation: With patient Time For Goal Achievement: 03/19/15 Potential to Achieve Goals: Good    Frequency Min 5X/week   Barriers to discharge        Co-evaluation               End of Session Equipment Utilized During Treatment: Gait  belt Activity Tolerance: Patient tolerated treatment well Patient left: in chair;with call bell/phone within reach Nurse Communication: Mobility status         Time: [id number] PT Time Calculation (min) (ACUTE ONLY): 25 min   Charges:   PT Evaluation $Initial PT Evaluation Tier I: 1 Procedure PT Treatments $Gait Training: 8-22 mins   PT G Codes:        Tosh Glaze 03/05/2015, 4:26 PM

## 2015-03-05 NOTE — Progress Notes (Signed)
Pt stable Dressing dry Ready for dc to snf monday

## 2015-03-05 NOTE — Progress Notes (Signed)
Clinical Social Work Department CLINICAL SOCIAL WORK PLACEMENT NOTE 03/05/2015  Patient:  Steve Lowery,Steve Lowery  Account Number:  [id number] Admit date:  03/04/2015  Clinical Social Worker:  Cori RazorJAMIE Attikus Bartoszek, LCSW  Date/time:  03/05/2015 11:07 AM  Clinical Social Work is seeking post-discharge placement for this patient at the following level of care:   SKILLED NURSING   (*CSW will update this form in Epic as items are completed)   03/05/2015  Patient/family provided with [facility] Department of Clinical Social Work's list of facilities offering this level of care within the geographic area requested by the patient (or if unable, by the patient's family).  03/05/2015  Patient/family informed of their freedom to choose among providers that offer the needed level of care, that participate in Medicare, Medicaid or managed care program needed by the patient, have an available bed and are willing to accept the patient.    Patient/family informed of [facility]' ownership interest in [facility], as well as of the fact that they are under no obligation to receive care at this facility.  PASARR submitted to EDS on 03/05/2015 PASARR number received on 03/05/2015  FL2 transmitted to all facilities in geographic area requested by pt/family on  03/05/2015 FL2 transmitted to all facilities within larger geographic area on   Patient informed that his/her managed care company has contracts with or will negotiate with  certain facilities, including the following:     Patient/family informed of bed offers received:   Patient chooses bed at  Physician recommends and patient chooses bed at    Patient to be transferred to  on   Patient to be transferred to facility by  Patient and family notified of transfer on  Name of family member notified:    The following physician request were entered in Epic:   Additional Comments:  Cori RazorJamie Ellorie Kindall LCSW [phone]

## 2015-03-05 NOTE — Op Note (Signed)
NAMRene Lowery:  Frieden, Tymeer               ACCOUNT NO.:  [id number]  MEDICAL RECORD NO.:  [id number]  LOCATION:  1532                         [facility]  PHYSICIAN:  Burnard BuntingG. Scott Meghan Warshawsky, M.D.    DATE OF BIRTH:  1934/10/26  DATE OF PROCEDURE: DATE OF DISCHARGE:                              OPERATIVE REPORT   PREOPERATIVE DIAGNOSIS:  Left foot necrotizing fasciitis and infection.  POSTOPERATIVE DIAGNOSIS:  Left foot necrotizing fasciitis and infection.  PROCEDURE:  Left below-knee amputation.  SURGEON:  Burnard BuntingG. Scott Jumar Greenstreet, M.D.  ASSISTANT:  None.  ANESTHESIA:  General.  INDICATIONS:  Steve KocherMichael Zeigler is a 81 year old patient with severe left foot infection, who presents now for operative management after explanation of risks and benefits.  The patient basically had gas present in the entire foot and at the time of the amputation, it actually began to extend above the ankle.  PROCEDURE IN DETAIL:  The patient was brought to the operating room where general anesthesia was induced.  Perioperative IV antibiotics were maintained.  Left leg was prepped with DuraPrep solution, draped in a sterile manner.  Impervious stockinette placed over the foot, sealed with an [place].  Time-out was called.  Leg was elevated, but not exsanguinated.  Tourniquet was inflated at approximately 48 minutes. Incision was made approximately a hand's breadth distal to the tibial tubercle.  1/3rd axial circumference of the leg was incised perpendicular to the mechanical axis of the tibia.  Flaps then extended distally about 2/3rd of that distance and then posterior flap was created posteriorly.  The anterior compartment muscles were divided sharply with a knife after incising the skin and elevating the periosteal tissue off the tibia.  Both neurovascular bundles were identified, tagged, and divided with Kelly clamp in position.  Using an oscillating saw, tibia was beveled anteriorly and cut proximal to the skin incisions.   Fibula was then cut after exposure about 1 cm proximal to the tibial cut.  At this time, posterior neurovascular bundle was identified and tagged on 2 ends with 2 Kelly clamps.  This was then divided with electrocautery.  Amputation knife was used to complete the amputation.  The peroneal nerve and tibial nerve were mobilized, pulled distally, cut, and then allowed to resect proximally.  The vascular bundles were tied each x2 with 2-0 silk pops.  At this time, irrigation with 3 L of irrigating solution was performed.  Tourniquet was released. Bleeding points were encountered and controlled with electrocautery. Flaps were then closed using #1 Vicryl suture, 2-0 Vicryl suture, and skin staples.  The patient was fitted with a well-padded posterior splint to keep the leg straight.  He tolerated the procedure well without immediate complications. Transferred to the recovery room in stable condition.     Burnard BuntingG. Scott Jung Yurchak, M.D.     GSD/MEDQ  D:  03/05/2015  T:  03/05/2015  Job:  [id number]

## 2015-03-05 NOTE — Progress Notes (Signed)
INITIAL NUTRITION ASSESSMENT  DOCUMENTATION CODES Per approved criteria  -Severe  malnutrition in the context of social or environmental circumstances  Pt meets criteria for severe malnutrition in the context of social or environmental circumstances, as evidenced by severe muscle and fat wasting  INTERVENTION: Continue Multivitamin supplements Provide Prostat BID, each provides 100 kcal and 15g protein RD to monitor supplements  NUTRITION DIAGNOSIS: Malnutrition related to reduced appetite and PO intake as evidenced by moderate to severe muscle and fat tissue depletion.   Goal: Pt to meet >/= 90% of their estimated nutrition needs  Monitor:  PO, I/Os, acceptance of supplements, weight trends, labs  Reason for Assessment: Consulted for wound healing   81 y.o. male  Admitting Dx: Charcot's joint of left foot  ASSESSMENT: 81 year old male with past medical history of hypertension, dyslipidemia who [facility] with concern for left foot infection. Per patient, he has had progressive pain and swelling in left foot for some time now but unsure exactly for how long. Patient reported ulcer that is draining purulent material. Amputation of left leg bellow the knee.  Pt appears in good spirits at time of visit. 2 daughters in the room. Diet has just been advanced and he was looking at the menu to order lunch. Per pt, he has not had an appetite for almost a year, ever since his wife has been in SNF and he does not like to cook or eat alone. He likes meat and vegetables, but not fruit. Per daughters, he is a picky eater and is hard to please. Refuses any Glucerna or Ensure type supplements (tried them, hates the flavor, too sweet). Willing to try Prostat (has been ordered). He does not believe he had lost any weight, but admits that strength has been declining. Encouraged PO, especially protein intake.   Labs reviewed: BUN 46, Ca 7.8, Glu 143   Nutrition Focused Physical  Exam:  Subcutaneous Fat:  Orbital Region: moderate depletion Upper Arm Region: Severe depletion Thoracic and Lumbar Region: n/a  Muscle:  Temple Region: severe depletion Clavicle Bone Region: moderate depletion Clavicle and Acromion Bone Region: severe depletion Scapular Bone Region: severe depletion Dorsal Hand: severe depletion Patellar Region: moderate depletion Anterior Thigh Region: moderate depletion Posterior Calf Region: moderate depletion  Edema: none noted  Height: Ht Readings from Last 1 Encounters:  03/05/15 5\' 6"  (1.676 m)    Weight: Wt Readings from Last 1 Encounters:  03/05/15 172 lb 13.5 oz (78.4 kg)    Ideal Body Weight:  142 Lb (before amputation) 133 Lb (amputated)  % Ideal Body Weight:   129% (before amputation)   Wt Readings from Last 10 Encounters:  03/05/15 172 lb 13.5 oz (78.4 kg)  03/04/15 173 lb (78.472 kg)  03/04/15 173 lb (78.472 kg)  03/10/13 174 lb (78.926 kg)  12/09/12 178 lb 8 oz (80.967 kg)  08/16/12 175 lb 8 oz (79.606 kg)  07/19/12 177 lb 4 oz (80.4 kg)    Usual Body Weight: 175 Lb (prior to amputation)  % Usual Body Weight: 98%  BMI:  Body mass index is 27.91 kg/(m^2).  Estimated Nutritional Needs: (based on estimated current wt after amputation of 160Lb) Kcal: 1600 - 1800 kcal Protein: 120 - 130 Fluid: 1.6 - 1.8 L  Skin: Left leg incision (amputation)  Diet Order: Diet regular Room service appropriate?: Yes; Fluid consistency:: Thin  EDUCATION NEEDS: -No education needs identified at this time   Intake/Output Summary (Last 24 hours) at 03/05/15 1201 Last data filed  at 03/05/15 1000  Gross per 24 hour  Intake   1300 ml  Output    325 ml  Net    975 ml    Last BM: PTA  Labs:   Recent Labs Lab 03/04/15 1038 03/04/15 1535 03/05/15 0540  NA 136 138 138  K 3.2* 3.3* 3.6  CL 102 104 110  CO2 21 21 17*  BUN 53* 47* 46*  CREATININE 2.59* 2.41* 2.29*  CALCIUM 8.8 8.6 7.8*  MG  --  2.0  --   PHOS  --   3.1  --   GLUCOSE 180* 179* 143*    CBG (last 3)   Recent Labs  03/05/15 0048 03/05/15 0810 03/05/15 1146  GLUCAP 126* 118* 115*    Scheduled Meds: . amLODipine  10 mg Oral Daily  . enoxaparin (LOVENOX) injection  30 mg Subcutaneous Q24H  . insulin aspart  0-9 Units Subcutaneous TID WC  . omega-3 acid ethyl esters  1 g Oral Daily  . pantoprazole  40 mg Oral Daily  . piperacillin-tazobactam (ZOSYN)  IV  3.375 g Intravenous Q8H  . simvastatin  20 mg Oral QPM  . vancomycin  750 mg Intravenous Q24H    Continuous Infusions:   Past Medical History  Diagnosis Date  . Diabetes mellitus   . Arthritis   . Kidney disease   . Hypertension     Past Surgical History  Procedure Laterality Date  . Tonsillectomy and adenoidectomy  1942  . Testicle surgery  1982    Hydra Seal    Steve Lowery A. Steve Lowery Dietetic Intern Pager: [phone] 03/05/2015 1:09 PM

## 2015-03-05 NOTE — Progress Notes (Signed)
OT Cancellation Note  Patient Details Name: Steve KocherMichael Lowery MRN: [id number] DOB: Feb 22, 1934   Cancelled Treatment:    Reason Eval/Treat Not Completed: OT screened, no needs identified, will sign off - Pt with planned discharge to SNF and has Medicare insurance so OT eval not necessary for pre-approval.  Will defer OT eval to SNF.  Please contact [phone] if discharge plan changes/  Angelene GiovanniConarpe, Zakai Gonyea M  Deajah Erkkila [place], OTR/L [phone]  03/05/2015, 1:10 PM

## 2015-03-05 NOTE — Progress Notes (Signed)
Patient was received from PACU,  Alert and oriented X4, Just a sedated, VSS, Had a Left BKA, it is wrapped. Denies pain. Just wanted to rest.

## 2015-03-05 NOTE — Clinical Documentation Improvement (Signed)
MD's, NP's, and PA's  Noted patient with DM2 has the following conditions"osteomylitis" and Charcot's Joint " if these diagnoses are associated with his Diabetes please document in note and d/c summary?  Thank you   Possible Clinical Conditions?    Associated conditions:   DM cellulitis DM gangrene DM gastroparesis DM hyperosmolarity state DM ketoacidosis with or without coma DM osteomyelitis DM skin ulcer DM Charcot's joint Other Condition Cannot Clinically determine   Treatment: surg removal of foot, IV antibiotics   Thank You, Lavonda JumboLawanda J Minetta Krisher ,RN Clinical Documentation Specialist:  [phone]  [facility]- Health Information Management

## 2015-03-05 NOTE — Progress Notes (Signed)
Patient ID: Steve Lowery, male   DOB: 09-08-1934, 81 y.o.   MRN: [id number] [facility] PROGRESS NOTE  Steve Lowery WUJ:[id number] DOB: 09-08-1934 DOA: 03/04/2015 PCP: Lucila MaineSPENCER,SARA C, PA-C  Brief narrative:    81 year old male with past medical history of hypertension, dyslipidemia who [facility] with concern for left foot infection. Per patient, he has had progressive pain and swelling in left foot for some time now but unsure exactly for how long. Patient reported ulcer that is draining purulent material.   In ED, pt was hemodynamically stable. Blood work was notable for WBC count of 13.3, hemoglobin 11.5, potassium 3.2, creatinine 2.59 (baseline 2.4 in 2013), glucose 180, normal lactic acid. Left foot x ray showed extensive soft tissue emphysema throughout the foot worrisome for soft tissue infection but no definitive osteomyelitis. His left foot infection was concerning for necrotizing fasciitis. Ortho has seen the pt in consultation. Patient was started on broad-spectrum antibiotics. 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  Assessment/Plan:     Principal Problem: Septic arthritis and cellulitis / osteomyelitis / necrotizing fasciitis / Charcot's joint neuropathy - X-ray studies on the admission showed extensive soft tissue emphysema throughout the foot worrisome for soft tissue infection. MRI shows probable chronic neuropathic changes involving the midfoot with secondary septic arthritis and osteomyelitis, diffuse mild fasciitis and cellulitis with gas throughout the foot. - Patient is status post left BKA on 03/04/2015 by orthopedic surgery. No subsequent complications - Patient was started on broad-spectrum antibiotics since the admission, vancomycin and Zosyn. - Continue supportive care with analgesia as needed. - Placement to skilled nursing facility likely Monday.   Active Problems: Hyperlipidemia with target LDL less than 100 - Continue  simvastatin 20 mg at bedtime and omega 3 supplementation  HTN (hypertension) - Continue Norvasc 10 mg daily - Lisinopril on hold because of renal insufficiency  Pernicious anemia / anemia of chronic renal disease - Hemoglobin is 11.5, stable  CKD (chronic kidney disease) stage 4, GFR 15-29 ml/min - Baseline creatinine 2.4 in 2013 - On this admission, Cr 2.59, it is trending down, 2.59 his morning  Hypokalemia - Unclear etiology - Supplemented  - Potassium within normal limits this morning   DVT Prophylaxis  - Lovenox subcutaneous ordered  Code Status: Full.  Family Communication:  plan of care discussed with the patient Disposition Plan: To skilled nursing facility likely Monday; [facility]'s [facility]. Appreciate Child psychotherapistsocial worker assisting with discharge planning.  IV access:  Peripheral IV  Procedures and diagnostic studies:    Dg Ankle Complete Left 03/04/2015  1. Proximal extension of soft tissue emphysema into the anterior aspect of the distal lower leg. 2. No acute or significant osseous findings demonstrated at the ankle. 3. Extensive left foot abnormalities described separately.   Electronically Signed   By: Carey BullocksWilliam  Veazey M.D.   On: 03/04/2015 11:20   Mr Foot Left Wo Contrast 03/04/2015   Probable chronic neuropathic changes involving the midfoot with secondary septic arthritis and osteomyelitis. There is also diffuse myofasciitis and cellulitis with gas throughout the foot.  No definite involvement of the subtalar joints or ankle joint.  Probable septic tenosynovitis involving the medial and lateral ankle tendons.   Electronically Signed   By: Rudie MeyerP.  Gallerani M.D.   On: 03/04/2015 19:06   Mr Ankle Left  Wo Contrast 03/04/2015    Probable chronic neuropathic changes involving the midfoot with secondary septic arthritis and osteomyelitis. There is also diffuse myofasciitis and cellulitis with  gas throughout the foot.  No definite involvement of the subtalar  joints or ankle joint.  Probable septic tenosynovitis involving the medial and lateral ankle tendons.   Electronically Signed   By: Rudie Meyer M.D.   On: 03/04/2015 19:06   Dg Foot Complete Left 03/04/2015   1. Extensive soft tissue emphysema throughout the foot worrisome for soft tissue infection. 2. No definite signs of osteomyelitis. There are extensive neuropathic changes at the Lisfranc joint. 3. Consider MRI for further evaluation.   Electronically Signed   By: Carey Bullocks M.D.   On: 03/04/2015 11:23   Left BKA 03/04/2015   Medical Consultants:  Orthopedic surgery  Other Consultants:  Physical therapy  IAnti-Infectives:   Vancomycin 03/04/2015 --> Zosyn 03/04/2015 -->   Manson Passey, MD  [facility] Pager [phone]  If 7PM-7AM, please contact night-coverage [email or web address] Password [email or web address] 03/05/2015, 10:19 AM   LOS: 1 day    HPI/Subjective: No acute overnight events.  Objective: Filed Vitals:   03/05/15 0300 03/05/15 0402 03/05/15 0500 03/05/15 0958  BP:  126/62 126/65 131/61  Pulse:  85 83 83  Temp:  97.5 F (36.4 C) 97.4 F (36.3 C) 97.8 F (36.6 C)  TempSrc:  Oral Oral Oral  Resp:  Height:  (1.676 m)     Weight: 78.4 kg (172 lb 13.5 oz)     SpO2:  96% 98% 98%    Intake/Output Summary (Last 24 hours) at 03/05/15 1019 Last data filed at 03/05/15 1000  Gross per 24 hour  Intake   1300 ml  Output    325 ml  Net    975 ml    Exam:   General:  Pt is alert, follows commands appropriately, not in acute distress  Cardiovascular: Regular rate and rhythm, S1/S2 (+)  Respiratory: Clear to auscultation bilaterally, no wheezing, no crackles, no rhonchi  Abdomen: Soft, non tender, non distended, bowel sounds present  Extremities: left BKA,right LE no swelling   Neuro: Grossly nonfocal  Data Reviewed: Basic Metabolic Panel:  Recent Labs Lab 03/04/15 1038 03/04/15 1535 03/05/15 0540  NA 136 138 138  K 3.2* 3.3* 3.6  CL 102  104 110  CO2 21 21 17*  GLUCOSE 180* 179* 143*  BUN 53* 47* 46*  CREATININE 2.59* 2.41* 2.29*  CALCIUM 8.8 8.6 7.8*  MG  --  2.0  --   PHOS  --  3.1  --    Liver Function Tests:  Recent Labs Lab 03/04/15 1535 03/05/15 0540  AST 22 25  ALT 13 16  ALKPHOS 68 61  BILITOT 1.0 1.0  PROT 6.0 5.5*  ALBUMIN 2.3* 2.1*   No results for input(s): LIPASE, AMYLASE in the last 168 hours. No results for input(s): AMMONIA in the last 168 hours. CBC:  Recent Labs Lab 03/04/15 1038 03/05/15 0540  WBC 13.3* 10.4  NEUTROABS 11.2*  --   HGB 11.5* 10.6*  HCT 34.9* 33.3*  MCV 85.1 85.8  PLT 338 315   Cardiac Enzymes: No results for input(s): CKTOTAL, CKMB, CKMBINDEX, TROPONINI in the last 168 hours. BNP: Invalid input(s): POCBNP CBG:  Recent Labs Lab 03/04/15 1700 03/04/15 2120 03/05/15 0048 03/05/15 0810  GLUCAP 131* 116* 126* 118*    Recent Results (from the past 240 hour(s))  Wound culture     Status: None (Preliminary result)   Collection Time: 03/04/15 10:30 AM  Result Value Ref Range Status   Specimen Description WOUND LEFT FOOT  Final  Special Requests Normal  Final   Gram Stain PENDING  Incomplete   Culture   Final    Culture reincubated for better growth Performed at [facility]    Report Status PENDING  Incomplete  Blood culture (routine x 2)     Status: None (Preliminary result)   Collection Time: 03/04/15 10:38 AM  Result Value Ref Range Status   Specimen Description BLOOD LEFT ARM  Final   Special Requests BOTTLES DRAWN AEROBIC AND ANAEROBIC 5 CC EA  Final   Culture   Final           BLOOD CULTURE RECEIVED NO GROWTH TO DATE CULTURE WILL BE HELD FOR 5 DAYS BEFORE ISSUING A FINAL NEGATIVE REPORT Performed at [facility]    Report Status PENDING  Incomplete  Blood culture (routine x 2)     Status: None (Preliminary result)   Collection Time: 03/04/15 11:35 AM  Result Value Ref Range Status   Specimen Description BLOOD RIGHT ARM  Final    Special Requests BOTTLES DRAWN AEROBIC AND ANAEROBIC  Final   Culture   Final           BLOOD CULTURE RECEIVED NO GROWTH TO DATE CULTURE WILL BE HELD FOR 5 DAYS BEFORE ISSUING A FINAL NEGATIVE REPORT Performed at [facility]    Report Status PENDING  Incomplete  Surgical pcr screen     Status: None   Collection Time: 03/04/15  9:30 PM  Result Value Ref Range Status   MRSA, PCR NEGATIVE NEGATIVE Final   Staphylococcus aureus NEGATIVE NEGATIVE Final     Scheduled Meds: . amLODipine  10 mg Oral Daily  . enoxaparin (LOVENOX) injection  30 mg Subcutaneous Q24H  . insulin aspart  0-9 Units Subcutaneous TID WC  . omega-3 acid ethyl esters  1 g Oral Daily  . pantoprazole  40 mg Oral Daily  . piperacillin-tazobactam (ZOSYN)  IV  3.375 g Intravenous Q8H  . simvastatin  20 mg Oral QPM  . vancomycin  750 mg Intravenous Q24H

## 2015-03-06 DIAGNOSIS — N184 Chronic kidney disease, stage 4 (severe): Secondary | ICD-10-CM

## 2015-03-06 DIAGNOSIS — M14672 Charcot's joint, left ankle and foot: Secondary | ICD-10-CM

## 2015-03-06 DIAGNOSIS — D72829 Elevated white blood cell count, unspecified: Secondary | ICD-10-CM

## 2015-03-06 DIAGNOSIS — M726 Necrotizing fasciitis: Principal | ICD-10-CM

## 2015-03-06 DIAGNOSIS — E43 Unspecified severe protein-calorie malnutrition: Secondary | ICD-10-CM | POA: Insufficient documentation

## 2015-03-06 LAB — WOUND CULTURE: SPECIAL REQUESTS: NORMAL

## 2015-03-06 LAB — GLUCOSE, CAPILLARY
GLUCOSE-CAPILLARY: 123 mg/dL — AB (ref 70–99)
GLUCOSE-CAPILLARY: 168 mg/dL — AB (ref 70–99)
GLUCOSE-CAPILLARY: 132 mg/dL — AB (ref 70–99)
Glucose-Capillary: 129 mg/dL — ABNORMAL HIGH (ref 70–99)
Glucose-Capillary: 144 mg/dL — ABNORMAL HIGH (ref 70–99)

## 2015-03-06 NOTE — Progress Notes (Addendum)
Patient ID: Corby Vandenberghe, male   DOB: 1934-03-25, 81 y.o.   MRN: [id number] [facility] PROGRESS NOTE  Depaul Arizpe WUJ:[id number] DOB: Apr 10, 1934 DOA: 03/04/2015 PCP: Lucila Maine  Brief narrative:    81 year old male with past medical history of hypertension, dyslipidemia who presented to [facility] ED with concern for left foot infection. Per patient, he has had progressive pain and swelling in left foot for some time now but unsure exactly for how long. Patient reported ulcer that is draining purulent material.   In ED, pt was hemodynamically stable. Blood work was notable for WBC count of 13.3, hemoglobin 11.5, potassium 3.2, creatinine 2.59 (baseline 2.4 in 2013), glucose 180, normal lactic acid. Left foot x ray showed extensive soft tissue emphysema throughout the foot worrisome for soft tissue infection but no definitive osteomyelitis. His left foot infection was concerning for necrotizing fasciitis. Ortho has seen the pt in consultation. Patient was started on broad-spectrum antibiotics. 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  Assessment/Plan:    Principal Problem: Septic arthritis and cellulitis / osteomyelitis / necrotizing fasciitis / Charcot's joint neuropathy / leukocytosis  - X-ray studies on the admission showed extensive soft tissue emphysema throughout the foot worrisome for soft tissue infection. MRI shows probable chronic neuropathic changes involving the midfoot with secondary septic arthritis and osteomyelitis, diffuse mild fasciitis and cellulitis with gas throughout the foot. - Patient had left BKA on 03/04/2015 by Dr. Dorene Grebe - Patient was on vancomycin and Zosyn since admission. Continue for next 24 hours and switch to oral Sunday. - Placement to skilled nursing facility likely Monday.   Active Problems: Hyperlipidemia with target LDL less than 100 - Continue simvastatin 20 mg at bedtime and omega 3 supplementation  HTN  (hypertension) - Continue Norvasc 10 mg daily - Lisinopril on hold because of renal insufficiency. BP 124/58.  Pernicious anemia / anemia of chronic renal disease - Hemoglobin 10.6, stable.  CKD (chronic kidney disease) stage 4, GFR 15-29 ml/min - Baseline creatinine 2.4 in 2013 - On this admission, Cr 2.59, it is trending down, 2.29 today.  Hypokalemia - Unclear etiology - Supplemented    DVT Prophylaxis  - Lovenox subcutaneous ordered while pt is in hospital    Code Status: Full.  Family Communication: plan of care discussed with the patient Disposition Plan: To skilled nursing facility likely Monday   IV access:  Peripheral IV  Procedures and diagnostic studies:    Dg Ankle Complete Left 03/04/2015 1. Proximal extension of soft tissue emphysema into the anterior aspect of the distal lower leg. 2. No acute or significant osseous findings demonstrated at the ankle. 3. Extensive left foot abnormalities described separately. Electronically Signed By: Carey Bullocks M.D. On: 03/04/2015 11:20   Mr Foot Left Wo Contrast 03/04/2015 Probable chronic neuropathic changes involving the midfoot with secondary septic arthritis and osteomyelitis. There is also diffuse myofasciitis and cellulitis with gas throughout the foot. No definite involvement of the subtalar joints or ankle joint. Probable septic tenosynovitis involving the medial and lateral ankle tendons. Electronically Signed By: Rudie Meyer M.D. On: 03/04/2015 19:06   Mr Ankle Left Wo Contrast 03/04/2015 Probable chronic neuropathic changes involving the midfoot with secondary septic arthritis and osteomyelitis. There is also diffuse myofasciitis and cellulitis with gas throughout the foot. No definite involvement of the subtalar joints or ankle joint. Probable septic tenosynovitis involving the medial and lateral ankle tendons. Electronically Signed By: Rudie Meyer M.D. On: 03/04/2015 19:06   Dg  Foot Complete Left 03/04/2015  1. Extensive soft tissue emphysema throughout the foot worrisome for soft tissue infection. 2. No definite signs of osteomyelitis. There are extensive neuropathic changes at the Lisfranc joint. 3. Consider MRI for further evaluation. Electronically Signed By: Carey BullocksWilliam Veazey M.D. On: 03/04/2015 11:23   Left BKA 03/04/2015  Medical Consultants:  Orthopedic surgery   Other Consultants:  Physical therapy  IAnti-Infectives:   Vancomycin 03/04/2015 --> Zosyn 03/04/2015 -->   Manson PasseyEVINE, ALMA, MD  [facility] Pager [phone]  If 7PM-7AM, please contact night-coverage [email or web address] Password [email or web address] 03/06/2015, 12:09 PM   LOS: 2 days    HPI/Subjective: No acute overnight events.  Objective: Filed Vitals:   03/05/15 1741 03/05/15 2129 03/06/15 0600 03/06/15 1021  BP: 136/64 131/57 120/59 121/55  Pulse: 95 88 87   Temp: 97.5 F (36.4 C) 98.1 F (36.7 C) 98.4 F (36.9 C)   TempSrc: Axillary Oral Oral   Resp: 18 18 18    Height:      Weight:   79.8 kg (175 lb 14.8 oz)   SpO2: 95% 95% 96%     Intake/Output Summary (Last 24 hours) at 03/06/15 1209 Last data filed at 03/06/15 0600  Gross per 24 hour  Intake    250 ml  Output   1600 ml  Net  -1350 ml    Exam:   General: Pt is alert, awake, no distress   Cardiovascular: RRR, S1/S2 (+)  Respiratory: bilateral air entry, no wheezing   Abdomen: appreciate bowel sounds, non tender abdomen   Extremities: left BKA, no edema in RLE, pulses palpable   Neuro: Grossly nonfocal  Data Reviewed: Basic Metabolic Panel:  Recent Labs Lab 03/04/15 1038 03/04/15 1535 03/05/15 0540  NA 136 138 138  K 3.2* 3.3* 3.6  CL 102 104 110  CO2 21 21 17*  GLUCOSE 180* 179* 143*  BUN 53* 47* 46*  CREATININE 2.59* 2.41* 2.29*  CALCIUM 8.8 8.6 7.8*  MG  --  2.0  --   PHOS  --  3.1  --    Liver Function Tests:  Recent Labs Lab 03/04/15 1535 03/05/15 0540  AST 22 25  ALT 13 16  ALKPHOS 68  61  BILITOT 1.0 1.0  PROT 6.0 5.5*  ALBUMIN 2.3* 2.1*   No results for input(s): LIPASE, AMYLASE in the last 168 hours. No results for input(s): AMMONIA in the last 168 hours. CBC:  Recent Labs Lab 03/04/15 1038 03/05/15 0540  WBC 13.3* 10.4  NEUTROABS 11.2*  --   HGB 11.5* 10.6*  HCT 34.9* 33.3*  MCV 85.1 85.8  PLT 338 315   Cardiac Enzymes: No results for input(s): CKTOTAL, CKMB, CKMBINDEX, TROPONINI in the last 168 hours. BNP: Invalid input(s): POCBNP CBG:  Recent Labs Lab 03/05/15 0048 03/05/15 0810 03/05/15 1146 03/05/15 1635 03/05/15 2111  GLUCAP 126* 118* 115* 116* 170*    Wound culture     Status: None   Collection Time: 03/04/15 10:30 AM  Result Value Ref Range Status   Specimen Description WOUND LEFT FOOT  Final   Special Requests Normal  Final   Gram Stain   Final    MODERATE WBC PRESENT, PREDOMINANTLY PMN NO SQUAMOUS EPITHELIAL CELLS SEEN MODERATE GRAM POSITIVE COCCI IN PAIRS    Culture   Final    MULTIPLE ORGANISMS PRESENT, NONE PREDOMINANT   Report Status 03/06/2015 FINAL  Final  Blood culture (routine x 2)     Status: None (Preliminary result)   Collection Time: 03/04/15 10:38 AM  Result Value Ref  Range Status   Specimen Description BLOOD LEFT ARM  Final   Special Requests BOTTLES DRAWN AEROBIC AND ANAEROBIC 5 CC EA  Final   Culture   Final           BLOOD CULTURE RECEIVED NO GROWTH TO DATE   Report Status PENDING  Incomplete  Blood culture (routine x 2)     Status: None (Preliminary result)   Collection Time: 03/04/15 11:35 AM  Result Value Ref Range Status   Specimen Description BLOOD RIGHT ARM  Final   Special Requests BOTTLES DRAWN AEROBIC AND ANAEROBIC  Final   Culture   Final           BLOOD CULTURE RECEIVED NO GROWTH TO DATE     Report Status PENDING  Incomplete  Surgical pcr screen     Status: None   Collection Time: 03/04/15  9:30 PM  Result Value Ref Range Status   MRSA, PCR NEGATIVE NEGATIVE Final   Staphylococcus  aureus NEGATIVE NEGATIVE Final  Anaerobic culture     Status: None (Preliminary result)   Collection Time: 03/04/15 10:44 PM  Result Value Ref Range Status   Specimen Description WOUND LEFT FOOT  Final   Special Requests NONE  Final   Gram Stain   Final    NO WBC SEEN NO SQUAMOUS EPITHELIAL CELLS SEEN FEW GRAM POSITIVE COCCI IN PAIRS IN CLUSTERS Performed at [facility]    Culture   Final    NO ANAEROBES ISOLATED; CULTURE IN PROGRESS FOR 5 DAYS Performed at [facility]    Report Status PENDING  Incomplete  Wound culture     Status: None (Preliminary result)   Collection Time: 03/04/15 10:44 PM  Result Value Ref Range Status   Specimen Description WOUND LEFT FOOT  Final   Special Requests NONE  Final   Gram Stain   Final    NO WBC SEEN NO SQUAMOUS EPITHELIAL CELLS SEEN FEW GRAM POSITIVE COCCI IN PAIRS IN CLUSTERS Performed at [facility]    Culture   Final    Culture reincubated for better growth Performed at [facility]    Report Status PENDING  Incomplete     Scheduled Meds: . amLODipine  10 mg Oral Daily  . enoxaparin (LOVENOX) injection  30 mg Subcutaneous Q24H  . feeding supplement (PRO-STAT SUGAR FREE 64)  30 mL Oral BID  . insulin aspart  0-9 Units Subcutaneous TID WC  . omega-3 acid ethyl esters  1 g Oral Daily  . pantoprazole  40 mg Oral Daily  . piperacillin-tazobactam (ZOSYN)  IV  3.375 g Intravenous Q8H  . simvastatin  20 mg Oral QPM  . vancomycin  750 mg Intravenous Q24H   Continuous Infusions:

## 2015-03-06 NOTE — Progress Notes (Signed)
Stable from ortho standpoint.  Dressing c/d/i.  Will try to dc patient to [facility] when able.  Likely Monday.  Mayra ReelN. Jaquez Deloma Spindle, MD [facility] [phone] 9:32 AM

## 2015-03-06 NOTE — Progress Notes (Signed)
Physical Therapy Treatment Patient Details Name: Steve KocherMichael Lowery MRN: [id number] DOB: 1934-11-09 Today's Date: 03/06/2015    History of Present Illness L BKA s/p necrotizing fasciitis    PT Comments    Pt very motivated and progressing with mobility.  Follow Up Recommendations  SNF     Equipment Recommendations  None recommended by PT    Recommendations for Other Services       Precautions / Restrictions Precautions Precautions: Fall Restrictions Weight Bearing Restrictions: No    Mobility  Bed Mobility Overal bed mobility: Needs Assistance Bed Mobility: Supine to Sit     Supine to sit: Min assist     General bed mobility comments: cues for sequence with min assist to guard new limb  Transfers Overall transfer level: Needs assistance Equipment used: Rolling walker (2 wheeled) Transfers: Sit to/from Stand Sit to Stand: Min assist;+2 physical assistance;+2 safety/equipment;From elevated surface         General transfer comment: cues for LE management and use of UES to self assist; Physical assist to bring wt up and fwd and to initially balance  Ambulation/Gait Ambulation/Gait assistance: Min assist;+2 physical assistance;+2 safety/equipment Ambulation Distance (Feet): 34 Feet Assistive device: Rolling walker (2 wheeled) Gait Pattern/deviations: Step-to pattern;Decreased step length - right;Decreased step length - left;Shuffle Gait velocity: decr   General Gait Details: cues for posture, sequence, position from RW and UE WB   Stairs            Wheelchair Mobility    Modified Rankin (Stroke Patients Only)       Balance                                    Cognition Arousal/Alertness: Awake/alert Behavior During Therapy: WFL for tasks assessed/performed Overall Cognitive Status: Within Functional Limits for tasks assessed                      Exercises Amputee Exercises Quad Sets: AROM;Both;10 reps;Supine Hip  ABduction/ADduction: AROM;Left;10 reps;Supine Straight Leg Raises: AROM;10 reps;Supine;Left    General Comments        Pertinent Vitals/Pain Pain Assessment: 0-10 Pain Score: 0-No pain Pain Location: L residual limb Pain Intervention(s): Limited activity within patient's tolerance;Monitored during session    Home Living                      Prior Function            PT Goals (current goals can now be found in the care plan section) Acute Rehab PT Goals Patient Stated Goal: [facility] and return to [facility] PT Goal Formulation: With patient Time For Goal Achievement: 03/19/15 Potential to Achieve Goals: Good Progress towards PT goals: Progressing toward goals    Frequency  Min 5X/week    PT Plan Current plan remains appropriate    Co-evaluation             End of Session Equipment Utilized During Treatment: Gait belt Activity Tolerance: Patient tolerated treatment well Patient left: in chair;with call bell/phone within reach     Time: [id number] PT Time Calculation (min) (ACUTE ONLY): 30 min  Charges:  $Gait Training: 8-22 mins $Therapeutic Exercise: 8-22 mins                    G Codes:      Steve Lowery 03/06/2015, 1:15 PM

## 2015-03-07 DIAGNOSIS — E1169 Type 2 diabetes mellitus with other specified complication: Secondary | ICD-10-CM

## 2015-03-07 DIAGNOSIS — L089 Local infection of the skin and subcutaneous tissue, unspecified: Secondary | ICD-10-CM

## 2015-03-07 LAB — CBC
HEMATOCRIT: 31.4 % — AB (ref 39.0–52.0)
Hemoglobin: 10 g/dL — ABNORMAL LOW (ref 13.0–17.0)
MCH: 27.2 pg (ref 26.0–34.0)
MCHC: 31.8 g/dL (ref 30.0–36.0)
MCV: 85.3 fL (ref 78.0–100.0)
Platelets: 357 10*3/uL (ref 150–400)
RBC: 3.68 MIL/uL — AB (ref 4.22–5.81)
RDW: 14 % (ref 11.5–15.5)
WBC: 10.2 10*3/uL (ref 4.0–10.5)

## 2015-03-07 LAB — BASIC METABOLIC PANEL
ANION GAP: 11 (ref 5–15)
BUN: 42 mg/dL — ABNORMAL HIGH (ref 6–23)
CALCIUM: 7.9 mg/dL — AB (ref 8.4–10.5)
CO2: 19 mmol/L (ref 19–32)
CREATININE: 2.64 mg/dL — AB (ref 0.50–1.35)
Chloride: 114 mmol/L — ABNORMAL HIGH (ref 96–112)
GFR, EST AFRICAN AMERICAN: 25 mL/min — AB (ref >90)
GFR, EST NON AFRICAN AMERICAN: 22 mL/min — AB (ref >90)
Glucose, Bld: 128 mg/dL — ABNORMAL HIGH (ref 70–99)
Potassium: 3.4 mmol/L — ABNORMAL LOW (ref 3.5–5.1)
Sodium: 144 mmol/L (ref 135–145)

## 2015-03-07 LAB — GLUCOSE, CAPILLARY
GLUCOSE-CAPILLARY: 168 mg/dL — AB (ref 70–99)
GLUCOSE-CAPILLARY: 122 mg/dL — AB (ref 70–99)
Glucose-Capillary: 111 mg/dL — ABNORMAL HIGH (ref 70–99)
Glucose-Capillary: 94 mg/dL (ref 70–99)

## 2015-03-07 LAB — WOUND CULTURE: Gram Stain: NONE SEEN

## 2015-03-07 MED ORDER — DOXYCYCLINE HYCLATE 100 MG PO TABS
100.0000 mg | ORAL_TABLET | Freq: Two times a day (BID) | ORAL | Status: DC
  Administered 2015-03-07 – 2015-03-08 (×3): 100 mg via ORAL
  Filled 2015-03-07 (×4): qty 1

## 2015-03-07 MED ORDER — POTASSIUM CHLORIDE CRYS ER 20 MEQ PO TBCR
40.0000 meq | EXTENDED_RELEASE_TABLET | Freq: Once | ORAL | Status: AC
  Administered 2015-03-07: 40 meq via ORAL
  Filled 2015-03-07: qty 2

## 2015-03-07 NOTE — Progress Notes (Addendum)
Patient ID: Steve Lowery, male   DOB: May 18, 1934, 81 y.o.   MRN: [id number] [facility] PROGRESS NOTE  Steve Lowery WUJ:[id number] DOB: 06-Aug-1934 DOA: 03/04/2015 PCP: Lucila Maine  Brief narrative:    81 year old male with past medical history of hypertension, dyslipidemia who presented to [facility] ED with concern for left foot infection. Per patient, he has had progressive pain and swelling in left foot for some time now but unsure exactly for how long. Patient reported ulcer that is draining purulent material.   In ED, pt was hemodynamically stable. Blood work was notable for WBC count of 13.3, hemoglobin 11.5, potassium 3.2, creatinine 2.59 (baseline 2.4 in 2013), glucose 180, normal lactic acid. Left foot x ray showed extensive soft tissue emphysema throughout the foot worrisome for soft tissue infection but no definitive osteomyelitis. His left foot infection was concerning for necrotizing fasciitis. Ortho has seen the pt in consultation. Patient was started on broad-spectrum antibiotics. 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  Assessment/Plan:    Principal Problem: Septic arthritis and cellulitis / osteomyelitis / necrotizing fasciitis / Charcot's joint neuropathy / leukocytosis  - X-ray studies on the admission showed extensive soft tissue emphysema throughout the foot worrisome for soft tissue infection. MRI shows probable chronic neuropathic changes involving the midfoot with secondary septic arthritis and osteomyelitis, diffuse mild fasciitis and cellulitis with gas throughout the foot. - Patient had left BKA on 03/04/2015 by Dr. Dorene Grebe - He was on vanco and zosyn since admission. Since he had surgery and his blood cultures negative so far will change to doxycyline today.  - D/C to SNF tomorrow.    Active Problems: Hyperlipidemia with target LDL less than 100 - Continue simvastatin and omega 3 supplementation  HTN (hypertension) - Continue  Norvasc 10 mg daily - Lisinopril on hold because of renal insufficiency.  Pernicious anemia / anemia of chronic renal disease - Hemoglobin stable at 10.8 - No reports of bleeding  - No current indications for transfusion   CKD (chronic kidney disease) stage 4, GFR 15-29 ml/min - Baseline creatinine 2.4 in 2013 - Current creatinine 2.64; worsening likely from vancomycin which we will stop today.  - Continue to monitor renal function   Hypokalemia - Unclear etiology - Supplemented    DVT Prophylaxis  - Lovenox subcutaneous    Code Status: Full.  Family Communication: plan of care discussed with the patient Disposition Plan: To skilled nursing facility likely tomorrow    IV access:  Peripheral IV  Procedures and diagnostic studies:    Dg Ankle Complete Left 03/04/2015 1. Proximal extension of soft tissue emphysema into the anterior aspect of the distal lower leg. 2. No acute or significant osseous findings demonstrated at the ankle. 3. Extensive left foot abnormalities described separately. Electronically Signed By: Carey Bullocks M.D. On: 03/04/2015 11:20   Mr Foot Left Wo Contrast 03/04/2015 Probable chronic neuropathic changes involving the midfoot with secondary septic arthritis and osteomyelitis. There is also diffuse myofasciitis and cellulitis with gas throughout the foot. No definite involvement of the subtalar joints or ankle joint. Probable septic tenosynovitis involving the medial and lateral ankle tendons. Electronically Signed By: Rudie Meyer M.D. On: 03/04/2015 19:06   Mr Ankle Left Wo Contrast 03/04/2015 Probable chronic neuropathic changes involving the midfoot with secondary septic arthritis and osteomyelitis. There is also diffuse myofasciitis and cellulitis with gas throughout the foot. No definite involvement of the subtalar joints or ankle joint. Probable septic tenosynovitis involving the medial and lateral ankle tendons.  Electronically Signed By: Rudie MeyerP. Gallerani M.D. On: 03/04/2015 19:06   Dg Foot Complete Left 03/04/2015 1. Extensive soft tissue emphysema throughout the foot worrisome for soft tissue infection. 2. No definite signs of osteomyelitis. There are extensive neuropathic changes at the Lisfranc joint. 3. Consider MRI for further evaluation. Electronically Signed By: Carey BullocksWilliam Veazey M.D. On: 03/04/2015 11:23   Left BKA 03/04/2015  Medical Consultants:  Orthopedic surgery   Other Consultants:  Physical therapy  IAnti-Infectives:   Vancomycin 03/04/2015 --> 03/07/2015  Zosyn 03/04/2015 --> 03/07/2015  Doxycycline 03/07/2015 -->   Manson PasseyEVINE, ALMA, MD  [facility] Pager [phone]  If 7PM-7AM, please contact night-coverage [email or web address] Password [email or web address] 03/07/2015, 12:09 PM   LOS: 3 days    HPI/Subjective: No acute overnight events.  Objective: Filed Vitals:   03/06/15 1400 03/06/15 2200 03/07/15 0627 03/07/15 0922  BP: 124/58 131/72 138/64 127/60  Pulse: 90 84 74   Temp: 98.6 F (37 C) 97.8 F (36.6 C) 98.6 F (37 C)   TempSrc: Oral Oral Oral   Resp: 18 18 18    Height:      Weight:      SpO2: 96% 95% 95%     Intake/Output Summary (Last 24 hours) at 03/07/15 1209 Last data filed at 03/07/15 0600  Gross per 24 hour  Intake    780 ml  Output   1225 ml  Net   -445 ml    Exam:   General: Pt is not in distress   Cardiovascular: RRR, S1/S2 (+)  Respiratory: clear to auscultation, no wheezing   Abdomen: no tenderness to palpation, non distended, appreciate BS  Extremities: left BKA, palpable pulses in RLE  Neuro: Nonfocal  Data Reviewed: Basic Metabolic Panel:  Recent Labs Lab 03/04/15 1038 03/04/15 1535 03/05/15 0540 03/07/15 0516  NA 136 138 138 144  K 3.2* 3.3* 3.6 3.4*  CL 102 104 110 114*  CO2 21 21 17* 19  GLUCOSE 180* 179* 143* 128*  BUN 53* 47* 46* 42*  CREATININE 2.59* 2.41* 2.29* 2.64*  CALCIUM 8.8 8.6 7.8* 7.9*  MG  --  2.0  --   --    PHOS  --  3.1  --   --    Liver Function Tests:  Recent Labs Lab 03/04/15 1535 03/05/15 0540  AST 22 25  ALT 13 16  ALKPHOS 68 61  BILITOT 1.0 1.0  PROT 6.0 5.5*  ALBUMIN 2.3* 2.1*   No results for input(s): LIPASE, AMYLASE in the last 168 hours. No results for input(s): AMMONIA in the last 168 hours. CBC:  Recent Labs Lab 03/04/15 1038 03/05/15 0540 03/07/15 0516  WBC 13.3* 10.4 10.2  NEUTROABS 11.2*  --   --   HGB 11.5* 10.6* 10.0*  HCT 34.9* 33.3* 31.4*  MCV 85.1 85.8 85.3  PLT 338 315 357   Cardiac Enzymes: No results for input(s): CKTOTAL, CKMB, CKMBINDEX, TROPONINI in the last 168 hours. BNP: Invalid input(s): POCBNP CBG:  Recent Labs Lab 03/06/15 1244 03/06/15 1628 03/06/15 2210 03/07/15 0711 03/07/15 1158  GLUCAP 168* 144* 132* 111* 168*    Wound culture     Status: None   Collection Time: 03/04/15 10:30 AM  Result Value Ref Range Status   Specimen Description WOUND LEFT FOOT  Final   Special Requests Normal  Final   Gram Stain   Final    MODERATE WBC PRESENT, PREDOMINANTLY PMN NO SQUAMOUS EPITHELIAL CELLS SEEN MODERATE GRAM POSITIVE COCCI IN PAIRS    Culture   Final  MULTIPLE ORGANISMS PRESENT, NONE PREDOMINANT   Report Status 03/06/2015 FINAL  Final  Blood culture (routine x 2)     Status: None (Preliminary result)   Collection Time: 03/04/15 10:38 AM  Result Value Ref Range Status   Specimen Description BLOOD LEFT ARM  Final   Special Requests BOTTLES DRAWN AEROBIC AND ANAEROBIC 5 CC EA  Final   Culture   Final           BLOOD CULTURE RECEIVED NO GROWTH TO DATE   Report Status PENDING  Incomplete  Blood culture (routine x 2)     Status: None (Preliminary result)   Collection Time: 03/04/15 11:35 AM  Result Value Ref Range Status   Specimen Description BLOOD RIGHT ARM  Final   Special Requests BOTTLES DRAWN AEROBIC AND ANAEROBIC  Final   Culture   Final           BLOOD CULTURE RECEIVED NO GROWTH TO DATE     Report Status  PENDING  Incomplete  Surgical pcr screen     Status: None   Collection Time: 03/04/15  9:30 PM  Result Value Ref Range Status   MRSA, PCR NEGATIVE NEGATIVE Final   Staphylococcus aureus NEGATIVE NEGATIVE Final  Anaerobic culture     Status: None (Preliminary result)   Collection Time: 03/04/15 10:44 PM  Result Value Ref Range Status   Specimen Description WOUND LEFT FOOT  Final   Special Requests NONE  Final   Gram Stain   Final    NO WBC SEEN NO SQUAMOUS EPITHELIAL CELLS SEEN FEW GRAM POSITIVE COCCI IN PAIRS IN CLUSTERS Performed at [facility]    Culture   Final    NO ANAEROBES ISOLATED; CULTURE IN PROGRESS FOR 5 DAYS Performed at [facility]    Report Status PENDING  Incomplete  Wound culture     Status: None (Preliminary result)   Collection Time: 03/04/15 10:44 PM  Result Value Ref Range Status   Specimen Description WOUND LEFT FOOT  Final   Special Requests NONE  Final   Gram Stain   Final    NO WBC SEEN NO SQUAMOUS EPITHELIAL CELLS SEEN FEW GRAM POSITIVE COCCI IN PAIRS IN CLUSTERS Performed at [facility]    Culture   Final    Culture reincubated for better growth Performed at [facility]    Report Status PENDING  Incomplete     Scheduled Meds: . amLODipine  10 mg Oral Daily  . enoxaparin (LOVENOX) injection  30 mg Subcutaneous Q24H  . feeding supplement (PRO-STAT SUGAR FREE 64)  30 mL Oral BID  . insulin aspart  0-9 Units Subcutaneous TID WC  . omega-3 acid ethyl esters  1 g Oral Daily  . pantoprazole  40 mg Oral Daily  . piperacillin-tazobactam (ZOSYN)  IV  3.375 g Intravenous Q8H  . potassium chloride  40 mEq Oral Once  . simvastatin  20 mg Oral QPM  . vancomycin  750 mg Intravenous Q24H   Continuous Infusions:

## 2015-03-07 NOTE — Progress Notes (Signed)
Physical Therapy Treatment Patient Details Name: Steve KocherMichael Thor MRN: [id number] DOB: 07-Apr-1934 Today's Date: 03/07/2015    History of Present Illness L BKA s/p necrotizing fasciitis    PT Comments    Steady progress with mobility.  Pt reports possible dc SNF tomorrow.  Follow Up Recommendations  SNF     Equipment Recommendations  None recommended by PT    Recommendations for Other Services OT consult     Precautions / Restrictions Precautions Precautions: Fall Restrictions Weight Bearing Restrictions: No    Mobility  Bed Mobility Overal bed mobility: Needs Assistance Bed Mobility: Supine to Sit     Supine to sit: Min assist     General bed mobility comments: cues for sequence with min assist to guard new limb  Transfers Overall transfer level: Needs assistance Equipment used: Rolling walker (2 wheeled) Transfers: Sit to/from Stand Sit to Stand: Min assist         General transfer comment: cues for LE management and use of UES to self assist; Physical assist to bring wt up and fwd and to initially balance  Ambulation/Gait Ambulation/Gait assistance: Min assist Ambulation Distance (Feet): 47 Feet Assistive device: Rolling walker (2 wheeled) Gait Pattern/deviations: Step-to pattern;Decreased step length - right;Shuffle;Trunk flexed Gait velocity: decr   General Gait Details: cues for posture, sequence, position from RW and UE WB   Stairs            Wheelchair Mobility    Modified Rankin (Stroke Patients Only)       Balance                                    Cognition Arousal/Alertness: Awake/alert Behavior During Therapy: WFL for tasks assessed/performed Overall Cognitive Status: Within Functional Limits for tasks assessed                      Exercises Amputee Exercises Quad Sets: AROM;Both;10 reps;Supine Hip ABduction/ADduction: AROM;Left;Supine;20 reps Straight Leg Raises: AROM;Supine;Left;20 reps     General Comments        Pertinent Vitals/Pain Pain Assessment: 0-10 Pain Score: 0-No pain Pain Intervention(s): Limited activity within patient's tolerance;Monitored during session    Home Living                      Prior Function            PT Goals (current goals can now be found in the care plan section) Acute Rehab PT Goals Patient Stated Goal: [facility] and return to [place] PT Goal Formulation: With patient Time For Goal Achievement: 03/19/15 Potential to Achieve Goals: Good Progress towards PT goals: Progressing toward goals    Frequency  Min 5X/week    PT Plan Current plan remains appropriate    Co-evaluation             End of Session Equipment Utilized During Treatment: Gait belt Activity Tolerance: Patient tolerated treatment well Patient left: in chair;with call bell/phone within reach;with family/visitor present     Time: 1335-1400 PT Time Calculation (min) (ACUTE ONLY): 25 min  Charges:  $Gait Training: 8-22 mins $Therapeutic Exercise: 8-22 mins                    G Codes:      Eudelia Hiltunen 03/07/2015, 3:14 PM

## 2015-03-08 ENCOUNTER — Encounter (HOSPITAL_COMMUNITY): Payer: Self-pay | Admitting: Orthopedic Surgery

## 2015-03-08 DIAGNOSIS — I10 Essential (primary) hypertension: Secondary | ICD-10-CM

## 2015-03-08 LAB — GLUCOSE, CAPILLARY
Glucose-Capillary: 108 mg/dL — ABNORMAL HIGH (ref 70–99)
Glucose-Capillary: 180 mg/dL — ABNORMAL HIGH (ref 70–99)

## 2015-03-08 MED ORDER — ONDANSETRON HCL 4 MG PO TABS: 4.0000 mg | ORAL_TABLET | Freq: Four times a day (QID) | ORAL | Status: AC | PRN

## 2015-03-08 MED ORDER — ACETAMINOPHEN 325 MG PO TABS: 650.0000 mg | ORAL_TABLET | Freq: Four times a day (QID) | ORAL | Status: AC | PRN

## 2015-03-08 MED ORDER — PRO-STAT SUGAR FREE PO LIQD: 30.0000 mL | Freq: Two times a day (BID) | ORAL | Status: AC

## 2015-03-08 MED ORDER — DOXYCYCLINE HYCLATE 100 MG PO TABS: 100.0000 mg | ORAL_TABLET | Freq: Two times a day (BID) | ORAL | Status: AC

## 2015-03-08 MED ORDER — HYDROCODONE-ACETAMINOPHEN 10-325 MG PO TABS: 1.0000 | ORAL_TABLET | ORAL | Status: AC | PRN

## 2015-03-08 NOTE — Progress Notes (Signed)
Discharge instructions given to pt with all questions answered. Pt voided and bladder scan showed 131cc post voiding after catheter was removed. Pt to discharge to SNF.

## 2015-03-08 NOTE — Progress Notes (Signed)
Physical Therapy Treatment Patient Details Name: Rene KocherMichael Balzarini MRN: [id number] DOB: [birth year] Today's Date: 03/08/2015    History of Present Illness L BKA s/p necrotizing fasciitis    PT Comments    SW requesting PT assess pt's ability for car transfers to see if family will be able to transfer or if pt needed non-emergency transportation.  Practiced transfers and pt appropriate for car transfers.  Daughters present and they were educated (and reviewed with pt) LE positioning, importance of hip/knee extension, sensory input in order to prepare for eventual prosthesis.  Pt and family were very appreciative of PT's input and pt is very motivated to return home after SNF rehab.  Follow Up Recommendations  SNF     Equipment Recommendations  None recommended by PT    Recommendations for Other Services       Precautions / Restrictions Precautions Precautions: Fall Restrictions Weight Bearing Restrictions: No    Mobility  Bed Mobility   Bed Mobility: Supine to Sit;Rolling Rolling: Modified independent (Device/Increase time)   Supine to sit: Min guard Sit to supine: Min guard   General bed mobility comments: cues for technique, but did not physically assist.  Transfers Overall transfer level: Needs assistance Equipment used: Rolling walker (2 wheeled) Transfers: Sit to/from Office managertand;Stand Pivot Transfers;Squat Pivot Transfers Sit to Stand: Min assist;Mod assist (from low surface to simulate car) Stand pivot transfers: Min assist Squat pivot transfers: Min assist;Mod assist (from low surface to simulate car)     General transfer comment: Pt practiced SPT with RW bed<> recliner and then squat pivot bed <> recliner simulating car transfer for planned d/c today to SNF.  Ambulation/Gait             General Gait Details: deferred gait today   Stairs            Wheelchair Mobility    Modified Rankin (Stroke Patients Only)       Balance                                    Cognition Arousal/Alertness: Awake/alert Behavior During Therapy: WFL for tasks assessed/performed Overall Cognitive Status: Within Functional Limits for tasks assessed                      Exercises Amputee Exercises Quad Sets: AROM;Both;10 reps;Supine Towel Squeeze: 5 reps;Strengthening Hip Extension: Sidelying;Left;5 reps    General Comments General comments (skin integrity, edema, etc.): After simulating car transfers, pt and 2 daughters were educated on importance of knee and hip extension, positioning in w/c, phantom pain, and increasing touch/sensory input to residual limb.       Pertinent Vitals/Pain Pain Assessment: No/denies pain    Home Living                      Prior Function            PT Goals (current goals can now be found in the care plan section) Acute Rehab PT Goals Patient Stated Goal: [facility] and return to [place] PT Goal Formulation: With patient Time For Goal Achievement: 03/19/15 Potential to Achieve Goals: Good Progress towards PT goals: Progressing toward goals    Frequency  Min 5X/week    PT Plan Current plan remains appropriate    Co-evaluation             End of Session Equipment Utilized During  Treatment: Gait belt Activity Tolerance: Patient tolerated treatment well Patient left: in bed;with call bell/phone within reach;with family/visitor present (waiting to get dressed to transfer to SNF)     Time: 1133-1200 PT Time Calculation (min) (ACUTE ONLY): 27 min  Charges:  $Therapeutic Exercise: 8-22 mins $Therapeutic Activity: 8-22 mins                    G Codes:      Sakiyah Shur LUBECK 03/08/2015, 12:25 PM

## 2015-03-08 NOTE — Progress Notes (Signed)
CSW assisting with d/c planning. [facility] has a bed available today for pt if stable for d/c. CSW will continue to follow [facility]  Cori RazorJamie Larra Crunkleton LCSW [phone]

## 2015-03-08 NOTE — Discharge Summary (Addendum)
Physician Discharge Summary  Baltasar Twilley ZOX:[id number] DOB: 1933/12/20 DOA: 03/04/2015  PCP: Teena Irani, PA-C  Admit date: 03/04/2015 Discharge date: 03/08/2015  Recommendations for Outpatient Follow-up:  1. Patient will continue doxycycline for 5 days on discharge. 2. Check creatinine per skilled nursing facility protocol. Creatinine is 2.6 prior to discharge.  Discharge Diagnoses:  Principal Problem:   Charcot's joint of left foot Active Problems:   Necrotizing fasciitis   Leukocytosis   Hyperlipidemia with target LDL less than 100   HTN (hypertension)   Pernicious anemia   CKD (chronic kidney disease) stage 4, GFR 15-29 ml/min   Hypokalemia   Diabetic foot infection   Protein-calorie malnutrition, severe    Discharge Condition: stable   Diet recommendation: as tolerated   History of present illness:  81 year old male with past medical history of hypertension, dyslipidemia who presented to [facility] ED with concern for left foot infection. Per patient, he has had progressive pain and swelling in left foot for some time now but unsure exactly for how long. Patient reported ulcer that is draining purulent material.   In ED, pt was hemodynamically stable. Blood work was notable for WBC count of 13.3, hemoglobin 11.5, potassium 3.2, creatinine 2.59 (baseline 2.4 in 2013), glucose 180, normal lactic acid. Left foot x ray showed extensive soft tissue emphysema throughout the foot worrisome for soft tissue infection but no definitive osteomyelitis. His left foot infection was concerning for necrotizing fasciitis. Ortho has seen the pt in consultation. Patient was started on broad-spectrum antibiotics. 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  Assessment/Plan:    Principal Problem: Septic arthritis and cellulitis / osteomyelitis / necrotizing fasciitis / Charcot's joint neuropathy / leukocytosis  - X-ray studies on the admission showed extensive soft tissue  emphysema throughout the foot worrisome for soft tissue infection. MRI shows probable chronic neuropathic changes involving the midfoot with secondary septic arthritis and osteomyelitis, diffuse mild fasciitis and cellulitis with gas throughout the foot. - Patient had left BKA on 03/04/2015 by Dr. Dorene Grebe - He was on vanco and zosyn since admission. Since he had surgery and his blood cultures negative so far  - Patient will continue doxycycline on discharge for 5 more days. - Patient is stable for discharge to skilled nursing facility today.   Active Problems: Diabetes, type 2 with neuropathy, controlled - A1c 6.6 indicating good control. He is not on any PO meds for DM - Will defer to PCP to initiate antihyperglycemic regimen on outpt basis.  Hyperlipidemia with target LDL less than 100 - Continue simvastatin and omega 3 supplementation  HTN (hypertension) - Continue Norvasc 10 mg daily - Lisinopril on hold because of renal insufficiency.  Pernicious anemia / anemia of chronic renal disease - Hemoglobin stable at 10.8 - No reports of bleeding  - No current indications for transfusion   CKD (chronic kidney disease) stage 4, GFR 15-29 ml/min - Baseline creatinine 2.4 in 2013 - Current creatinine 2.64; worsening likely from vancomycin which was stopped. Patient is on doxycycline. - Recheck kidney function in about one week after discharge to make sure creatinine is improving.  Hypokalemia - Unclear etiology - Supplemented    DVT Prophylaxis  - Lovenox subcutaneous while patient in hospital.   Code Status: Full.  Family Communication: Family not at the bedside.    IV access:  Peripheral IV  Procedures and diagnostic studies:   Dg Ankle Complete Left 03/04/2015 1. Proximal extension of soft tissue emphysema into the anterior aspect of the  distal lower leg. 2. No acute or significant osseous findings demonstrated at the ankle. 3. Extensive left foot  abnormalities described separately. Electronically Signed By: Carey Bullocks M.D. On: 03/04/2015 11:20   Mr Foot Left Wo Contrast 03/04/2015 Probable chronic neuropathic changes involving the midfoot with secondary septic arthritis and osteomyelitis. There is also diffuse myofasciitis and cellulitis with gas throughout the foot. No definite involvement of the subtalar joints or ankle joint. Probable septic tenosynovitis involving the medial and lateral ankle tendons. Electronically Signed By: Rudie Meyer M.D. On: 03/04/2015 19:06   Mr Ankle Left Wo Contrast 03/04/2015 Probable chronic neuropathic changes involving the midfoot with secondary septic arthritis and osteomyelitis. There is also diffuse myofasciitis and cellulitis with gas throughout the foot. No definite involvement of the subtalar joints or ankle joint. Probable septic tenosynovitis involving the medial and lateral ankle tendons. Electronically Signed By: Rudie Meyer M.D. On: 03/04/2015 19:06   Dg Foot Complete Left 03/04/2015 1. Extensive soft tissue emphysema throughout the foot worrisome for soft tissue infection. 2. No definite signs of osteomyelitis. There are extensive neuropathic changes at the Lisfranc joint. 3. Consider MRI for further evaluation. Electronically Signed By: Carey Bullocks M.D. On: 03/04/2015 11:23   Left BKA 03/04/2015  Medical Consultants:  Orthopedic surgery   Other Consultants:  Physical therapy  IAnti-Infectives:   Vancomycin 03/04/2015 --> 03/07/2015  Zosyn 03/04/2015 --> 03/07/2015  Doxycycline 03/07/2015 --> for 5 days on discharge  Signed:  Manson Passey, MD  [facility] 03/08/2015, 10:06 AM  Pager #: [phone]   Discharge Exam: Filed Vitals:   03/08/15 0930  BP: 132/64  Pulse:   Temp:   Resp:    Filed Vitals:   03/07/15 1400 03/07/15 2035 03/08/15 0545 03/08/15 0930  BP: 135/70 132/66 132/64 132/64  Pulse: 75 80 81   Temp: 98.6  F (37 C) 98 F (36.7 C) 98.2 F (36.8 C)   TempSrc: Oral Oral Oral   Resp: Height:      Weight:      SpO2: 98% 96% 94%     General: Pt is alert, follows commands appropriately, not in acute distress Cardiovascular: Regular rate and rhythm, S1/S2 +, no murmurs Respiratory: Clear to auscultation bilaterally, no wheezing, no crackles, no rhonchi Abdominal: Soft, non tender, non distended, bowel sounds +, no guarding Extremities: left BKA, RLE no swelling  Neuro: Grossly nonfocal  Discharge Instructions  Discharge Instructions    Call MD for:  difficulty breathing, headache or visual disturbances    Complete by:  As directed      Call MD for:  persistant nausea and vomiting    Complete by:  As directed      Call MD for:  severe uncontrolled pain    Complete by:  As directed      Diet - low sodium heart healthy    Complete by:  As directed      Discharge instructions    Complete by:  As directed   1. Take doxycycline for 5 more days on discharge. 2. Please recheck kidney function per skilled nursing facility protocol. Creatinine on discharge 2.64.     Increase activity slowly    Complete by:  As directed             Medication List    STOP taking these medications        lisinopril 20 MG tablet  Commonly known as:  PRINIVIL,ZESTRIL     NON FORMULARY  zaleplon 5 MG capsule  Commonly known as:  SONATA      TAKE these medications        acetaminophen 325 MG tablet  Commonly known as:  TYLENOL  Take 2 tablets (650 mg total) by mouth every 6 (six) hours as needed for mild pain (or Fever >/= 101).     amLODipine 10 MG tablet  Commonly known as:  NORVASC  Take 10 mg by mouth daily.     aspirin 81 MG tablet  Take 81 mg by mouth daily.     CALCIUM PO  Take 1 tablet by mouth 2 (two) times daily.     CENTRUM SILVER ADULT 50+ PO  Take 1 tablet by mouth daily.     doxycycline 100 MG tablet  Commonly known as:  VIBRA-TABS  Take 1 tablet (100 mg  total) by mouth every 12 (twelve) hours.     feeding supplement (PRO-STAT SUGAR FREE 64) Liqd  Take 30 mLs by mouth 2 (two) times daily.     fish oil-omega-3 fatty acids 1000 MG capsule  Take 1 g by mouth daily.     HYDROcodone-acetaminophen 10-325 MG per tablet  Commonly known as:  NORCO  Take 1-2 tablets by mouth every 4 (four) hours as needed (breakthrough pain).     IRON-FOLIC ACID PO  Take 1 tablet by mouth daily.     omeprazole 20 MG capsule  Commonly known as:  PRILOSEC  Take 1 capsule (20 mg total) by mouth daily as needed.     ondansetron 4 MG tablet  Commonly known as:  ZOFRAN  Take 1 tablet (4 mg total) by mouth every 6 (six) hours as needed for nausea.     simvastatin 20 MG tablet  Commonly known as:  ZOCOR  Take 1 tablet (20 mg total) by mouth every evening.           Follow-up Information    Follow up with SPENCER,SARA C, PA-C. Schedule an appointment as soon as possible for a visit in 1 week.   Specialty:  Physician Assistant   Why:  Follow up appt after recent hospitalization   Contact information:   [address] [place] [id number] [phone]        The results of significant diagnostics from this hospitalization (including imaging, microbiology, ancillary and laboratory) are listed below for reference.    Significant Diagnostic Studies: Dg Ankle Complete Left  03/04/2015   CLINICAL DATA:  Wound infection. History of diabetes, kidney disease and arthritis. Initial encounter.  EXAM: LEFT ANKLE COMPLETE - 3+ VIEW  COMPARISON:  None.  FINDINGS: At the ankle, there is no evidence of acute fracture, dislocation, bone destruction or significant arthropathy. Extensive abnormalities within the foot are described separately. There is soft tissue emphysema throughout the foot with proximal extension anteriorly into the distal lower leg. No foreign bodies identified.  IMPRESSION: 1. Proximal extension of soft tissue emphysema into the anterior aspect of  the distal lower leg. 2. No acute or significant osseous findings demonstrated at the ankle. 3. Extensive left foot abnormalities described separately.   Electronically Signed   By: Carey BullocksWilliam  Veazey M.D.   On: 03/04/2015 11:20   Mr Foot Left Wo Contrast  03/04/2015   CLINICAL DATA:  Foot deformity with pain, swelling and draining wounds.  EXAM: MRI OF THE LEFT FOREFOOT WITHOUT CONTRAST; MRI OF THE LEFT ANKLE WITHOUT CONTRAST  TECHNIQUE: Multiplanar, multisequence MR imaging was performed. No intravenous contrast was administered.  COMPARISON:  Radiographs 03/04/2015  FINDINGS: As demonstrated on the plain films there are advanced neuropathic changes involving the midfoot with dorsal and lateral subluxations/dislocations and bone fragmentation. There is diffuse gas throughout the soft tissues and within the joints. Findings are most consistent with underlying neuropathic process and superimposed septic arthritis and osteomyelitis with diffuse myofasciitis and cellulitis.  Suspect septic tenosynovitis involving the medial and lateral ankle tendons extending up above the ankle. I do not see any definite findings for osteomyelitis involving the tibia or fibula. There is an ankle joint effusion but no definite destructive bony changes to suggest septic arthritis involving this joint. The subtalar joints also appear to be grossly intact.  IMPRESSION: Probable chronic neuropathic changes involving the midfoot with secondary septic arthritis and osteomyelitis. There is also diffuse myofasciitis and cellulitis with gas throughout the foot.  No definite involvement of the subtalar joints or ankle joint.  Probable septic tenosynovitis involving the medial and lateral ankle tendons.   Electronically Signed   By: Rudie Meyer M.D.   On: 03/04/2015 19:06   Mr Ankle Left  Wo Contrast  03/04/2015   CLINICAL DATA:  Foot deformity with pain, swelling and draining wounds.  EXAM: MRI OF THE LEFT FOREFOOT WITHOUT CONTRAST; MRI OF  THE LEFT ANKLE WITHOUT CONTRAST  TECHNIQUE: Multiplanar, multisequence MR imaging was performed. No intravenous contrast was administered.  COMPARISON:  Radiographs 03/04/2015  FINDINGS: As demonstrated on the plain films there are advanced neuropathic changes involving the midfoot with dorsal and lateral subluxations/dislocations and bone fragmentation. There is diffuse gas throughout the soft tissues and within the joints. Findings are most consistent with underlying neuropathic process and superimposed septic arthritis and osteomyelitis with diffuse myofasciitis and cellulitis.  Suspect septic tenosynovitis involving the medial and lateral ankle tendons extending up above the ankle. I do not see any definite findings for osteomyelitis involving the tibia or fibula. There is an ankle joint effusion but no definite destructive bony changes to suggest septic arthritis involving this joint. The subtalar joints also appear to be grossly intact.  IMPRESSION: Probable chronic neuropathic changes involving the midfoot with secondary septic arthritis and osteomyelitis. There is also diffuse myofasciitis and cellulitis with gas throughout the foot.  No definite involvement of the subtalar joints or ankle joint.  Probable septic tenosynovitis involving the medial and lateral ankle tendons.   Electronically Signed   By: Rudie Meyer M.D.   On: 03/04/2015 19:06   Dg Foot Complete Left  03/04/2015   CLINICAL DATA:  Wound infection. History of diabetes, kidney disease and arthritis. Initial encounter.  EXAM: LEFT FOOT - COMPLETE 3+ VIEW  COMPARISON:  None.  FINDINGS: The bones are diffusely demineralized. There is extensive abnormality at the Lisfranc joint with fragmentation of the metatarsal bases and cuneiform bones and dorsal lateral subluxation. These findings are consistent with an underlying neuropathic joint. The talus, calcaneus, cuboid and navicular are intact. There is extensive soft tissue emphysema throughout  the foot with associated soft tissue swelling, worrisome for superimposed infection. No foreign bodies identified.  IMPRESSION: 1. Extensive soft tissue emphysema throughout the foot worrisome for soft tissue infection. 2. No definite signs of osteomyelitis. There are extensive neuropathic changes at the Lisfranc joint. 3. Consider MRI for further evaluation.   Electronically Signed   By: Carey Bullocks M.D.   On: 03/04/2015 11:23    Microbiology: Recent Results (from the past 240 hour(s))  Wound culture     Status: None   Collection Time: 03/04/15 10:30 AM  Result Value Ref Range Status   Specimen Description WOUND LEFT FOOT  Final   Special Requests Normal  Final   Gram Stain   Final    MODERATE WBC PRESENT, PREDOMINANTLY PMN NO SQUAMOUS EPITHELIAL CELLS SEEN MODERATE GRAM POSITIVE COCCI IN PAIRS Performed at [facility]    Culture   Final    MULTIPLE ORGANISMS PRESENT, NONE PREDOMINANT Note: NO STAPHYLOCOCCUS AUREUS ISOLATED NO GROUP A STREP (S.PYOGENES) ISOLATED Performed at [facility]    Report Status 03/06/2015 FINAL  Final  Blood culture (routine x 2)     Status: None (Preliminary result)   Collection Time: 03/04/15 10:38 AM  Result Value Ref Range Status   Specimen Description BLOOD LEFT ARM  Final   Special Requests BOTTLES DRAWN AEROBIC AND ANAEROBIC 5 CC EA  Final   Culture   Final           BLOOD CULTURE RECEIVED NO GROWTH TO DATE CULTURE WILL BE HELD FOR 5 DAYS BEFORE ISSUING A FINAL NEGATIVE REPORT Performed at [facility]    Report Status PENDING  Incomplete  Blood culture (routine x 2)     Status: None (Preliminary result)   Collection Time: 03/04/15 11:35 AM  Result Value Ref Range Status   Specimen Description BLOOD RIGHT ARM  Final   Special Requests BOTTLES DRAWN AEROBIC AND ANAEROBIC  Final   Culture   Final           BLOOD CULTURE RECEIVED NO GROWTH TO DATE CULTURE WILL BE HELD FOR 5 DAYS BEFORE ISSUING A FINAL NEGATIVE  REPORT Performed at [facility]    Report Status PENDING  Incomplete  Surgical pcr screen     Status: None   Collection Time: 03/04/15  9:30 PM  Result Value Ref Range Status   MRSA, PCR NEGATIVE NEGATIVE Final   Staphylococcus aureus NEGATIVE NEGATIVE Final    Comment:        The Xpert SA Assay (FDA approved for NASAL specimens in patients over 35 years of age), is one component of a comprehensive surveillance program.  Test performance has been validated by [facility] for patients greater than or equal to 82 year old. It is not intended to diagnose infection nor to guide or monitor treatment.   Anaerobic culture     Status: None (Preliminary result)   Collection Time: 03/04/15 10:44 PM  Result Value Ref Range Status   Specimen Description WOUND LEFT FOOT  Final   Special Requests NONE  Final   Gram Stain   Final    NO WBC SEEN NO SQUAMOUS EPITHELIAL CELLS SEEN FEW GRAM POSITIVE COCCI IN PAIRS IN CLUSTERS Performed at [facility]    Culture   Final    NO ANAEROBES ISOLATED; CULTURE IN PROGRESS FOR 5 DAYS Performed at [facility]    Report Status PENDING  Incomplete  Wound culture     Status: None   Collection Time: 03/04/15 10:44 PM  Result Value Ref Range Status   Specimen Description WOUND LEFT FOOT  Final   Special Requests NONE  Final   Gram Stain   Final    NO WBC SEEN NO SQUAMOUS EPITHELIAL CELLS SEEN FEW GRAM POSITIVE COCCI IN PAIRS IN CLUSTERS Performed at [facility]    Culture   Final    MULTIPLE ORGANISMS PRESENT, NONE PREDOMINANT Note: NO STAPHYLOCOCCUS AUREUS ISOLATED NO GROUP A STREP (S.PYOGENES) ISOLATED Performed at [facility]  Report Status 03/07/2015 FINAL  Final     Labs: Basic Metabolic Panel:  Recent Labs Lab 03/04/15 1038 03/04/15 1535 03/05/15 0540 03/07/15 0516  NA 136 138 138 144  K 3.2* 3.3* 3.6 3.4*  CL 102 104 110 114*  CO2 21 21 17* 19  GLUCOSE 180* 179* 143* 128*   BUN 53* 47* 46* 42*  CREATININE 2.59* 2.41* 2.29* 2.64*  CALCIUM 8.8 8.6 7.8* 7.9*  MG  --  2.0  --   --   PHOS  --  3.1  --   --    Liver Function Tests:  Recent Labs Lab 03/04/15 1535 03/05/15 0540  AST 22 25  ALT 13 16  ALKPHOS 68 61  BILITOT 1.0 1.0  PROT 6.0 5.5*  ALBUMIN 2.3* 2.1*   No results for input(s): LIPASE, AMYLASE in the last 168 hours. No results for input(s): AMMONIA in the last 168 hours. CBC:  Recent Labs Lab 03/04/15 1038 03/05/15 0540 03/07/15 0516  WBC 13.3* 10.4 10.2  NEUTROABS 11.2*  --   --   HGB 11.5* 10.6* 10.0*  HCT 34.9* 33.3* 31.4*  MCV 85.1 85.8 85.3  PLT 338 315 357   Cardiac Enzymes: No results for input(s): CKTOTAL, CKMB, CKMBINDEX, TROPONINI in the last 168 hours. BNP: BNP (last 3 results) No results for input(s): BNP in the last 8760 hours.  ProBNP (last 3 results) No results for input(s): PROBNP in the last 8760 hours.  CBG:  Recent Labs Lab 03/07/15 0711 03/07/15 1158 03/07/15 1805 03/07/15 2106 03/08/15 0719  GLUCAP 111* 168* 94 122* 108*    Time coordinating discharge: Over 30 minutes

## 2015-03-08 NOTE — Progress Notes (Signed)
Phoned report into [facility] and spoke with nurse Lovie. All questions answered. Told nurse to phone writer directly with any other questions.

## 2015-03-08 NOTE — Progress Notes (Signed)
Clinical Social Work Department CLINICAL SOCIAL WORK PLACEMENT NOTE 03/08/2015  Patient:  Steve Lowery,Steve Lowery  Account Number:  [id number] Admit date:  03/04/2015  Clinical Social Worker:  Cori RazorJAMIE Abagael Kramm, LCSW  Date/time:  03/08/2015 2:30pm  Clinical Social Work is seeking post-discharge placement for this patient at the following level of care:   SKILLED NURSING   (*CSW will update this form in Epic as items are completed)   03/05/2015  [facility]/family provided with [facility] Department of Clinical Social Work's list of facilities offering this level of care within the geographic area requested by the patient (or if unable, by the patient's family).  03/05/2015  Patient/family informed of their freedom to choose among providers that offer the needed level of care, that participate in Medicare, Medicaid or managed care program needed by the patient, have an available bed and are willing to accept the patient.    Patient/family informed of [facility]' ownership interest in [facility], as well as of the fact that they are under no obligation to receive care at this facility.  PASARR submitted to EDS on 03/05/2015 PASARR number received on 03/05/2015  FL2 transmitted to all facilities in geographic area requested by pt/family on  03/05/2015 FL2 transmitted to all facilities within larger geographic area on   Patient informed that his/her managed care company has contracts with or will negotiate with  certain facilities, including the following:     Patient/family informed of bed offers received:  03/08/2015 [facility] Physician recommends and patient chooses bed at    [facility]  03/08/2015 Patient to be transferred to facility by Family Patient and family notified of transfer on 03/08/2015 Name of family member notified:  Daughters  The following physician request were  entered in Epic:   Additional Comments: Pt / family are in agreement with d/c to SNF today. PT has approved transport by car. Ortho will see pt prior to d/c. SNF is aware pt will arrive, possibly, after 5pm. NSG has reviewed d/c summary, avs, scripts. Scripts included in d/c packet. D/C packet has been provided to pt/daughters.  Cori RazorJamie Alayasia Breeding LCSW [phone]

## 2015-03-08 NOTE — Discharge Instructions (Addendum)
Amputation Many new amputations occur each year. The most common causes of amputation of the lower extremity (the hip down) are:  Disease.  Injury caused in an accidents or wars (trauma).  Birth defects.  Lumps (tumors) that are cancer. Upper extremity amputation is usually the result of trauma or birth defect, with disease being a less common cause. COMMON PROBLEMS After an amputation a number of issues need to be considered. Getting around and self-care are early problems that must be dealt with. A complete rehabilitation program will help the amputee recover mobility. A team approach of caregivers helps the most. Caregivers that can provide a well rounded program include:   Physicians.  Therapists.  Nurses.  Social workers.  Psychologists. Usually there are problems with body image and coping with lifestyle changes. A grieving period similar to dealing with a death in the family is common after an amputation. Talking to a trained professional with experience in treating people with similar problems can be very helpful. When returning to a previous lifestyle, questions about sexuality can arise. Many of these uncertainties are normal. These can be discussed with your psychologist or rehabilitation specialist. REHABILITATION AND RETURN TO WORK AND ACTIVITIES Returning to recreational activities and employment are part of recovery. Many times, changes to recreation equipment can allow return to a sport or hobby. A device that substitutes the missing part of the body is called a prosthetic. Many prosthetic manufacturers produce components designed for sports. Be sure to discuss all of your leisure interests with your prosthetist. This is the person who helps provide you with custom made replacement limbs. Your physician will also help to select a prosthetic that will meet your needs. Employers will vary in their willingness to change a work environment in order to help people with  disabilities. Your therapists can perform job site evaluations. Your therapist can then make recommendations to help with your work area. Some amputees will not be able to return to previous jobs. Your local Office of Vocational Rehabilitation can assist you in job retraining.  Once you are past the initial rehabilitation stage you will have ongoing contact with caregivers and a prosthetist. You need to work closely with them in making decisions about your prosthetic device. PROGNOSIS  Amputation should not end your joy of life. There are people with limb loss in nearly all walks of life. They are in a wide variety of professions. They participate in nearly all sports. Ask your caregivers about support groups and sports organizations in your area. They can help you with referral to organizations that will be helpful to you. Document Released: 08/12/2002 Document Revised: 02/12/2012 Document Reviewed: 10/07/2007 [facility] Patient Information 2015 [place], [place]. This information is not intended to replace advice given to you by your health care provider. Make sure you discuss any questions you have with your health care provider.   From Dr August Saucerean - remove splint on Thursday and rewrap knee with ACE wrap - keep incision dry

## 2015-03-08 NOTE — Progress Notes (Signed)
Pt stable - doing well with PT Dc today - rehab to remove dressing Thursday and rewrap knee with ace wrap

## 2015-03-08 NOTE — Care Management Note (Signed)
    Page 1 of 1   03/08/2015     10:12:50 AM CARE MANAGEMENT NOTE 03/08/2015  Patient:  Steve Lowery,Steve Lowery   Account Number:  [id number]  Date Initiated:  03/08/2015  Documentation initiated by:  Lorenda IshiharaPEELE,Cote Mayabb  Subjective/Objective Assessment:   81 yo male admitted s/p LBKA. PTA lived at home alone.     Action/Plan:   SNF for rehab   Anticipated DC Date:  03/08/2015   Anticipated DC Plan:  SKILLED NURSING FACILITY  In-house referral  Clinical Social Worker      DC Planning Services  CM consult      Choice offered to / List presented to:             Status of service:  Completed, signed off Medicare Important Message given?  YES (If response is "NO", the following Medicare IM given date fields will be blank) Date Medicare IM given:  03/08/2015 Medicare IM given by:  [facility]Shaine Newmark Date Additional Medicare IM given:   Additional Medicare IM given by:    Discharge Disposition:  SKILLED NURSING FACILITY  Per UR Regulation:  Reviewed for med. necessity/level of care/duration of stay  If discussed at Long Length of Stay Meetings, dates discussed:    Comments:

## 2015-03-09 LAB — ANAEROBIC CULTURE: Gram Stain: NONE SEEN

## 2015-03-10 LAB — CULTURE, BLOOD (ROUTINE X 2)
Culture: NO GROWTH
Culture: NO GROWTH

## 2015-03-15 LAB — PREALBUMIN: Prealbumin: 7 mg/dL — ABNORMAL LOW (ref 21–43)

## 2015-03-15 LAB — C-REACTIVE PROTEIN: CRP: 29.4 mg/dL — ABNORMAL HIGH (ref <0.60)

## 2015-04-17 ENCOUNTER — Encounter (HOSPITAL_COMMUNITY): Payer: Self-pay | Admitting: Emergency Medicine

## 2015-04-17 ENCOUNTER — Emergency Department (HOSPITAL_COMMUNITY)
Admission: EM | Admit: 2015-04-17 | Discharge: 2015-04-17 | Disposition: A | Discharge status: Home / Self Care | Payer: Medicare Other | Attending: Emergency Medicine | Admitting: Emergency Medicine

## 2015-04-17 DIAGNOSIS — T83198A Other mechanical complication of other urinary devices and implants, initial encounter: Secondary | ICD-10-CM | POA: Diagnosis present

## 2015-04-17 DIAGNOSIS — Z87448 Personal history of other diseases of urinary system: Secondary | ICD-10-CM | POA: Insufficient documentation

## 2015-04-17 DIAGNOSIS — Z87891 Personal history of nicotine dependence: Secondary | ICD-10-CM | POA: Diagnosis not present

## 2015-04-17 DIAGNOSIS — Z7982 Long term (current) use of aspirin: Secondary | ICD-10-CM | POA: Diagnosis not present

## 2015-04-17 DIAGNOSIS — M1990 Unspecified osteoarthritis, unspecified site: Secondary | ICD-10-CM | POA: Diagnosis not present

## 2015-04-17 DIAGNOSIS — Z79899 Other long term (current) drug therapy: Secondary | ICD-10-CM | POA: Diagnosis not present

## 2015-04-17 DIAGNOSIS — I10 Essential (primary) hypertension: Secondary | ICD-10-CM | POA: Diagnosis not present

## 2015-04-17 DIAGNOSIS — E119 Type 2 diabetes mellitus without complications: Secondary | ICD-10-CM | POA: Insufficient documentation

## 2015-04-17 DIAGNOSIS — Y846 Urinary catheterization as the cause of abnormal reaction of the patient, or of later complication, without mention of misadventure at the time of the procedure: Secondary | ICD-10-CM | POA: Diagnosis not present

## 2015-04-17 DIAGNOSIS — T839XXA Unspecified complication of genitourinary prosthetic device, implant and graft, initial encounter: Secondary | ICD-10-CM

## 2015-04-17 NOTE — Discharge Instructions (Signed)
Foley Catheter Care °A Foley catheter is a soft, flexible tube that is placed into the bladder to drain urine. A Foley catheter may be inserted if: °· You leak urine or are not able to control when you urinate (urinary incontinence). °· You are not able to urinate when you need to (urinary retention). °· You had prostate surgery or surgery on the genitals. °· You have certain medical conditions, such as multiple sclerosis, dementia, or a spinal cord injury. °If you are going home with a Foley catheter in place, follow the instructions below. °TAKING CARE OF THE CATHETER °1. Wash your hands with soap and water. °2. Using mild soap and warm water on a clean washcloth: °· Clean the area on your body closest to the catheter insertion site using a circular motion, moving away from the catheter. Never wipe toward the catheter because this could sweep bacteria up into the urethra and cause infection. °· Remove all traces of soap. Pat the area dry with a clean towel. For males, reposition the foreskin. °3. Attach the catheter to your leg so there is no tension on the catheter. Use adhesive tape or a leg strap. If you are using adhesive tape, remove any sticky residue left behind by the previous tape you used. °4. Keep the drainage bag below the level of the bladder, but keep it off the floor. °5. Check throughout the day to be sure the catheter is working and urine is draining freely. Make sure the tubing does not become kinked. °6. Do not pull on the catheter or try to remove it. Pulling could damage internal tissues. °TAKING CARE OF THE DRAINAGE BAGS °You will be given two drainage bags to take home. One is a large overnight drainage bag, and the other is a smaller leg bag that fits underneath clothing. You may wear the overnight bag at any time, but you should never wear the smaller leg bag at night. Follow the instructions below for how to empty, change, and clean your drainage bags. °Emptying the Drainage Bag °You must  empty your drainage bag when it is  -½ full or at least 2-3 times a day. °1. Wash your hands with soap and water. °2. Keep the drainage bag below your hips, below the level of your bladder. This stops urine from going back into the tubing and into your bladder. °3. Hold the dirty bag over the toilet or a clean container. °4. Open the pour spout at the bottom of the bag and empty the urine into the toilet or container. Do not let the pour spout touch the toilet, container, or any other surface. Doing so can place bacteria on the bag, which can cause an infection. °5. Clean the pour spout with a gauze pad or cotton ball that has rubbing alcohol on it. °6. Close the pour spout. °7. Attach the bag to your leg with adhesive tape or a leg strap. °8. Wash your hands well. °Changing the Drainage Bag °Change your drainage bag once a month or sooner if it starts to smell bad or look dirty. Below are steps to follow when changing the drainage bag. °1. Wash your hands with soap and water. °2. Pinch off the rubber catheter so that urine does not spill out. °3. Disconnect the catheter tube from the drainage tube at the connection valve. Do not let the tubes touch any surface. °4. Clean the end of the catheter tube with an alcohol wipe. Use a different alcohol wipe to clean the   end of the drainage tube. °5. Connect the catheter tube to the drainage tube of the clean drainage bag. °6. Attach the new bag to the leg with adhesive tape or a leg strap. Avoid attaching the new bag too tightly. °7. Wash your hands well. °Cleaning the Drainage Bag °1. Wash your hands with soap and water. °2. Wash the bag in warm, soapy water. °3. Rinse the bag thoroughly with warm water. °4. Fill the bag with a solution of white vinegar and water (1 cup vinegar to 1 qt warm water [.2 L vinegar to 1 L warm water]). Close the bag and soak it for 30 minutes in the solution. °5. Rinse the bag with warm water. °6. Hang the bag to dry with the pour spout open  and hanging downward. °7. Store the clean bag (once it is dry) in a clean plastic bag. °8. Wash your hands well. °PREVENTING INFECTION °· Wash your hands before and after handling your catheter. °· Take showers daily and wash the area where the catheter enters your body. Do not take baths. Replace wet leg straps with dry ones, if this applies. °· Do not use powders, sprays, or lotions on the genital area. Only use creams, lotions, or ointments as directed by your caregiver. °· For females, wipe from front to back after each bowel movement. °· Drink enough fluids to keep your urine clear or pale yellow unless you have a fluid restriction. °· Do not let the drainage bag or tubing touch or lie on the floor. °· Wear cotton underwear to absorb moisture and to keep your skin drier. °SEEK MEDICAL CARE IF:  °· Your urine is cloudy or smells unusually bad. °· Your catheter becomes clogged. °· You are not draining urine into the bag or your bladder feels full. °· Your catheter starts to leak. °SEEK IMMEDIATE MEDICAL CARE IF:  °· You have pain, swelling, redness, or pus where the catheter enters the body. °· You have pain in the abdomen, legs, lower back, or bladder. °· You have a fever. °· You see blood fill the catheter, or your urine is pink or red. °· You have nausea, vomiting, or chills. °· Your catheter gets pulled out. °MAKE SURE YOU:  °· Understand these instructions. °· Will watch your condition. °· Will get help right away if you are not doing well or get worse. °Document Released: 11/20/2005 Document Revised: 04/06/2014 Document Reviewed: 11/11/2012 °ExitCare® Patient Information ©2015 Exit[employer], LLC. This information is not intended to replace advice given to you by your health care provider. Make sure you discuss any questions you have with your health care provider. ° °

## 2015-04-17 NOTE — ED Provider Notes (Signed)
CSN: [id number]     Arrival date & time 04/17/15  0154 History   First MD Initiated Contact with Patient 04/17/15 0206     Chief Complaint  Patient presents with  . catheter issue     (Consider location/radiation/quality/duration/timing/severity/associated sxs/prior Treatment) HPI Comments: 81 year old male with a history of diabetes mellitus, hypertension, arthritis, and necrotizing fasciitis s/p left BKA presents to the emergency department for further evaluation of urinary urgency. Patient was discharged from [facility] facility yesterday; he has been there since discharge from the hospital after admission for necrotizing fasciitis. Patient with a Foley catheter in place. He reports that he began to feel a sensation of urgency at 2300. He described a pressure sensation in his suprapubic abdomen. Foley catheter bag last emptied at 1600. He states that he has had no drainage in his bag since it was last emptied. Patient reports that he had similar issues while in the rehabilitation facility. He states that symptoms resolved with irrigation of the Foley catheter. He denies any fever, nausea, vomiting, hematuria, or clots in his catheter bag.  The history is provided by the patient. No language interpreter was used.    Past Medical History  Diagnosis Date  . Diabetes mellitus   . Arthritis   . Kidney disease   . Hypertension    Past Surgical History  Procedure Laterality Date  . Tonsillectomy and adenoidectomy  1942  . Testicle surgery  1982    [facility]  . Amputation Left 03/04/2015    Procedure: AMPUTATION BELOW KNEE;  Surgeon: Cammy CopaScott Gregory Dean, MD;  Location: [facility];  Service: Orthopedics;  Laterality: Left;   Family History  Problem Relation Age of Onset  . Ovarian cancer Mother   . Diabetes Father   . Early death Neg Hx   . Heart disease Neg Hx   . Hyperlipidemia Neg Hx   . Hypertension Neg Hx   . Kidney disease Neg Hx   . Stroke Neg Hx   . Alcohol abuse  Neg Hx   . Arthritis Neg Hx    History  Substance Use Topics  . Smoking status: Former Smoker    Types: Pipe  . Smokeless tobacco: Never Used  . Alcohol Use: No    Review of Systems  Constitutional: Negative for fever.  Genitourinary: Positive for urgency. Negative for hematuria and penile swelling.  All other systems reviewed and are negative.   Allergies  Lipitor  Home Medications   Prior to Admission medications   Medication Sig Start Date End Date Taking? Authorizing Provider  amLODipine (NORVASC) 10 MG tablet Take 10 mg by mouth daily.   Yes Historical Provider, MD  aspirin 81 MG tablet Take 81 mg by mouth daily.   Yes Historical Provider, MD  CALCIUM PO Take 1 tablet by mouth 2 (two) times daily.   Yes Historical Provider, MD  fish oil-omega-3 fatty acids 1000 MG capsule Take 1 g by mouth daily.   Yes Historical Provider, MD  IRON-FOLIC ACID PO Take 1 tablet by mouth daily.   Yes Historical Provider, MD  Multiple Vitamins-Minerals (CENTRUM SILVER ADULT 50+ PO) Take 1 tablet by mouth daily.   Yes Historical Provider, MD  omeprazole (PRILOSEC) 20 MG capsule Take 1 capsule (20 mg total) by mouth daily as needed. 03/20/13  Yes Etta Grandchildhomas L Jones, MD  PRESCRIPTION MEDICATION Take 1 tablet by mouth daily.   Yes Historical Provider, MD  simvastatin (ZOCOR) 20 MG tablet Take 1 tablet (20 mg total) by mouth  every evening. 03/10/13  Yes Etta Grandchildhomas L Jones, MD  tamsulosin (FLOMAX) 0.4 MG CAPS capsule Take 0.4 mg by mouth daily.   Yes Historical Provider, MD  acetaminophen (TYLENOL) 325 MG tablet Take 2 tablets (650 mg total) by mouth every 6 (six) hours as needed for mild pain (or Fever >/= 101). Patient not taking: Reported on 04/17/2015 03/08/15   Alison MurrayAlma M Devine, MD  Amino Acids-Protein Hydrolys (FEEDING SUPPLEMENT, PRO-STAT SUGAR FREE 64,) LIQD Take 30 mLs by mouth 2 (two) times daily. Patient not taking: Reported on 04/17/2015 03/08/15   Alison MurrayAlma M Devine, MD  doxycycline (VIBRA-TABS) 100 MG tablet  Take 1 tablet (100 mg total) by mouth every 12 (twelve) hours. Patient not taking: Reported on 04/17/2015 03/08/15   Alison MurrayAlma M Devine, MD  HYDROcodone-acetaminophen [facility](NORCO) 10-325 MG per tablet Take 1-2 tablets by mouth every 4 (four) hours as needed (breakthrough pain). Patient not taking: Reported on 04/17/2015 03/08/15   Alison MurrayAlma M Devine, MD  ondansetron (ZOFRAN) 4 MG tablet Take 1 tablet (4 mg total) by mouth every 6 (six) hours as needed for nausea. Patient not taking: Reported on 04/17/2015 03/08/15   Alison MurrayAlma M Devine, MD   BP 159/77 mmHg  Pulse 86  Temp(Src) 97.9 F (36.6 C) (Oral)  Resp 18  SpO2 100%   Physical Exam  Constitutional: He is oriented to person, place, and time. He appears well-developed and well-nourished. No distress.  HENT:  Head: Normocephalic and atraumatic.  Eyes: Conjunctivae and EOM are normal. No scleral icterus.  Neck: Normal range of motion.  Pulmonary/Chest: Effort normal. No respiratory distress.  Genitourinary: Testes normal and penis normal. Uncircumcised.  Foley catheter in place. It was previously irrigated by nursing staff. No phimosis or paraphimosis. No drainage from the urethra. Foley catheter is draining. 650 mL appreciated in foley catheter bag.  Musculoskeletal: Normal range of motion.  Neurological: He is alert and oriented to person, place, and time. He exhibits normal muscle tone. Coordination normal.  Skin: Skin is warm and dry. No rash noted. He is not diaphoretic. No erythema. No pallor.  Psychiatric: He has a normal mood and affect. His behavior is normal.  Nursing note and vitals reviewed.   ED Course  Procedures (including critical care time) Labs Review Labs Reviewed - No data to display  Imaging Review No results found.   EKG Interpretation None      MDM   Final diagnoses:  Foley catheter problem, initial encounter    81 year old male presents to the emergency department for urinary urgency with lack of drainage from his Foley  catheter into his catheter bag. Symptoms resolved after irrigation and repositioning of the Foley catheter. Patient now with 650 mL of urine in his catheter bag. He states that his symptoms have resolved. No indication for further emergent workup. Will discharge home. Return precautions given.   Filed Vitals:   04/17/15 0156 04/17/15 0254  BP: 159/77 126/63  Pulse: 86 75  Temp: 97.9 F (36.6 C) 98.1 F (36.7 C)  TempSrc: Oral Oral  Resp: 18 16  SpO2: 100% 96%       Antony MaduraKelly Pacen Watford, PA-C 04/17/15 0258  April Palumbo, MD 04/17/15 [id number]

## 2015-04-17 NOTE — ED Notes (Signed)
Pt was d/c from [facility] this am. He reports that his foley catheter was last emptied around 1600. At 2300 he began feeling pressure as if he needed to urinate but no urine draining.

## 2015-05-12 ENCOUNTER — Emergency Department (HOSPITAL_COMMUNITY)
Admission: EM | Admit: 2015-05-12 | Discharge: 2015-05-12 | Disposition: A | Discharge status: Home / Self Care | Payer: Medicare Other | Attending: Emergency Medicine | Admitting: Emergency Medicine

## 2015-05-12 ENCOUNTER — Encounter (HOSPITAL_COMMUNITY): Payer: Self-pay | Admitting: *Deleted

## 2015-05-12 DIAGNOSIS — N390 Urinary tract infection, site not specified: Secondary | ICD-10-CM | POA: Diagnosis not present

## 2015-05-12 DIAGNOSIS — I10 Essential (primary) hypertension: Secondary | ICD-10-CM | POA: Diagnosis not present

## 2015-05-12 DIAGNOSIS — E119 Type 2 diabetes mellitus without complications: Secondary | ICD-10-CM | POA: Diagnosis not present

## 2015-05-12 DIAGNOSIS — Z87891 Personal history of nicotine dependence: Secondary | ICD-10-CM | POA: Insufficient documentation

## 2015-05-12 DIAGNOSIS — M1990 Unspecified osteoarthritis, unspecified site: Secondary | ICD-10-CM | POA: Insufficient documentation

## 2015-05-12 DIAGNOSIS — Z79899 Other long term (current) drug therapy: Secondary | ICD-10-CM | POA: Insufficient documentation

## 2015-05-12 DIAGNOSIS — R339 Retention of urine, unspecified: Secondary | ICD-10-CM | POA: Diagnosis present

## 2015-05-12 DIAGNOSIS — Z7982 Long term (current) use of aspirin: Secondary | ICD-10-CM | POA: Insufficient documentation

## 2015-05-12 LAB — URINALYSIS, ROUTINE W REFLEX MICROSCOPIC
Bilirubin Urine: NEGATIVE
Glucose, UA: NEGATIVE mg/dL
Ketones, ur: NEGATIVE mg/dL
NITRITE: POSITIVE — AB
PROTEIN: 30 mg/dL — AB
SPECIFIC GRAVITY, URINE: 1.01 (ref 1.005–1.030)
Urobilinogen, UA: 0.2 mg/dL (ref 0.0–1.0)
pH: 5.5 (ref 5.0–8.0)

## 2015-05-12 LAB — CBC WITH DIFFERENTIAL/PLATELET
BASOS ABS: 0 10*3/uL (ref 0.0–0.1)
Basophils Relative: 0 % (ref 0–1)
Eosinophils Absolute: 0.1 10*3/uL (ref 0.0–0.7)
Eosinophils Relative: 1 % (ref 0–5)
HCT: 36.1 % — ABNORMAL LOW (ref 39.0–52.0)
Hemoglobin: 12.3 g/dL — ABNORMAL LOW (ref 13.0–17.0)
Lymphocytes Relative: 7 % — ABNORMAL LOW (ref 12–46)
Lymphs Abs: 0.8 10*3/uL (ref 0.7–4.0)
MCH: 28.2 pg (ref 26.0–34.0)
MCHC: 34.1 g/dL (ref 30.0–36.0)
MCV: 82.8 fL (ref 78.0–100.0)
Monocytes Absolute: 0.7 10*3/uL (ref 0.1–1.0)
Monocytes Relative: 7 % (ref 3–12)
Neutro Abs: 9.1 10*3/uL — ABNORMAL HIGH (ref 1.7–7.7)
Neutrophils Relative %: 85 % — ABNORMAL HIGH (ref 43–77)
Platelets: 187 10*3/uL (ref 150–400)
RBC: 4.36 MIL/uL (ref 4.22–5.81)
RDW: 14.8 % (ref 11.5–15.5)
WBC: 10.7 10*3/uL — AB (ref 4.0–10.5)

## 2015-05-12 LAB — BASIC METABOLIC PANEL
ANION GAP: 11 (ref 5–15)
BUN: 28 mg/dL — AB (ref 6–20)
CALCIUM: 9 mg/dL (ref 8.9–10.3)
CO2: 19 mmol/L — ABNORMAL LOW (ref 22–32)
Chloride: 107 mmol/L (ref 101–111)
Creatinine, Ser: 1.58 mg/dL — ABNORMAL HIGH (ref 0.61–1.24)
GFR calc Af Amer: 47 mL/min — ABNORMAL LOW (ref >60)
GFR calc non Af Amer: 40 mL/min — ABNORMAL LOW (ref >60)
Glucose, Bld: 116 mg/dL — ABNORMAL HIGH (ref 65–99)
Potassium: 3.2 mmol/L — ABNORMAL LOW (ref 3.5–5.1)
SODIUM: 137 mmol/L (ref 135–145)

## 2015-05-12 LAB — URINE MICROSCOPIC-ADD ON

## 2015-05-12 MED ORDER — CEPHALEXIN 500 MG PO CAPS: 500.0000 mg | ORAL_CAPSULE | Freq: Four times a day (QID) | ORAL | Status: DC

## 2015-05-12 MED ORDER — CEPHALEXIN 500 MG PO CAPS: 500.0000 mg | ORAL_CAPSULE | Freq: Three times a day (TID) | ORAL | Status: AC

## 2015-05-12 NOTE — ED Provider Notes (Addendum)
CSN: [id number]     Arrival date & time 05/12/15  2133 History   First MD Initiated Contact with Patient 05/12/15 2138     Chief Complaint  Patient presents with  . Urinary Retention     (Consider location/radiation/quality/duration/timing/severity/associated sxs/prior Treatment) HPI Comments: Patient here complaining of urinary retention 9 hours. Patient had a Foley catheter discontinued 2 days ago and was doing okay until today when he started having suprapubic pressure. No flank pain. No fever or chills. No vomiting or diarrhea. Has been urinating only small amounts. Has been having prostate issues since surgery back in March. Has been seeing a urologist on a regular basis. Symptoms persistent and nothing makes them better. No treatment use prior to arrival  The history is provided by the patient.    Past Medical History  Diagnosis Date  . Diabetes mellitus   . Arthritis   . Kidney disease   . Hypertension    Past Surgical History  Procedure Laterality Date  . Tonsillectomy and adenoidectomy  1942  . Testicle surgery  1982    [facility]  . Amputation Left 03/04/2015    Procedure: AMPUTATION BELOW KNEE;  Surgeon: Meredith Pel, MD;  Location: [facility];  Service: Orthopedics;  Laterality: Left;   Family History  Problem Relation Age of Onset  . Ovarian cancer Mother   . Diabetes Father   . Early death Neg Hx   . Heart disease Neg Hx   . Hyperlipidemia Neg Hx   . Hypertension Neg Hx   . Kidney disease Neg Hx   . Stroke Neg Hx   . Alcohol abuse Neg Hx   . Arthritis Neg Hx    History  Substance Use Topics  . Smoking status: Former Smoker    Types: Pipe  . Smokeless tobacco: Never Used  . Alcohol Use: No    Review of Systems  All other systems reviewed and are negative.     Allergies  Lipitor  Home Medications   Prior to Admission medications   Medication Sig Start Date End Date Taking? Authorizing Provider  acetaminophen (TYLENOL) 325 MG tablet Take 2  tablets (650 mg total) by mouth every 6 (six) hours as needed for mild pain (or Fever >/= 101). Patient not taking: Reported on 04/17/2015 03/08/15   Robbie Lis, MD  Amino Acids-Protein Hydrolys (FEEDING SUPPLEMENT, PRO-STAT SUGAR FREE 64,) LIQD Take 30 mLs by mouth 2 (two) times daily. Patient not taking: Reported on 04/17/2015 03/08/15   Robbie Lis, MD  amLODipine (NORVASC) 10 MG tablet Take 10 mg by mouth daily.    Historical Provider, MD  aspirin 81 MG tablet Take 81 mg by mouth daily.    Historical Provider, MD  CALCIUM PO Take 1 tablet by mouth 2 (two) times daily.    Historical Provider, MD  doxycycline (VIBRA-TABS) 100 MG tablet Take 1 tablet (100 mg total) by mouth every 12 (twelve) hours. Patient not taking: Reported on 04/17/2015 03/08/15   Robbie Lis, MD  fish oil-omega-3 fatty acids 1000 MG capsule Take 1 g by mouth daily.    Historical Provider, MD  HYDROcodone-acetaminophen (NORCO) 10-325 MG per tablet Take 1-2 tablets by mouth every 4 (four) hours as needed (breakthrough pain). Patient not taking: Reported on 04/17/2015 03/08/15   Robbie Lis, MD  IRON-FOLIC ACID PO Take 1 tablet by mouth daily.    Historical Provider, MD  Multiple Vitamins-Minerals (CENTRUM SILVER ADULT 50+ PO) Take 1 tablet by mouth daily.  Historical Provider, MD  omeprazole (PRILOSEC) 20 MG capsule Take 1 capsule (20 mg total) by mouth daily as needed. 03/20/13   Janith Lima, MD  ondansetron (ZOFRAN) 4 MG tablet Take 1 tablet (4 mg total) by mouth every 6 (six) hours as needed for nausea. Patient not taking: Reported on 04/17/2015 03/08/15   Robbie Lis, MD  PRESCRIPTION MEDICATION Take 1 tablet by mouth daily.    Historical Provider, MD  simvastatin (ZOCOR) 20 MG tablet Take 1 tablet (20 mg total) by mouth every evening. 03/10/13   Janith Lima, MD  tamsulosin (FLOMAX) 0.4 MG CAPS capsule Take 0.4 mg by mouth daily.    Historical Provider, MD   BP 168/86 mmHg  Pulse 90  Temp(Src) 98.3 F (36.8 C) (Oral)   Resp 20  SpO2 100% Physical Exam  Constitutional: He is oriented to person, place, and time. He appears well-developed and well-nourished.  Non-toxic appearance. No distress.  HENT:  Head: Normocephalic and atraumatic.  Eyes: Conjunctivae, EOM and lids are normal. Pupils are equal, round, and reactive to light.  Neck: Normal range of motion. Neck supple. No tracheal deviation present. No thyroid mass present.  Cardiovascular: Normal rate, regular rhythm and normal heart sounds.  Exam reveals no gallop.   No murmur heard. Pulmonary/Chest: Effort normal and breath sounds normal. No stridor. No respiratory distress. He has no decreased breath sounds. He has no wheezes. He has no rhonchi. He has no rales.  Abdominal: Soft. Normal appearance and bowel sounds are normal. He exhibits no distension. There is tenderness in the suprapubic area. There is no rebound and no CVA tenderness.    Musculoskeletal: Normal range of motion. He exhibits no edema or tenderness.  Neurological: He is alert and oriented to person, place, and time. He has normal strength. No cranial nerve deficit or sensory deficit. GCS eye subscore is 4. GCS verbal subscore is 5. GCS motor subscore is 6.  Skin: Skin is warm and dry. No abrasion and no rash noted.  Psychiatric: He has a normal mood and affect. His speech is normal and behavior is normal.  Nursing note and vitals reviewed.   ED Course  Procedures (including critical care time) Labs Review Labs Reviewed  URINE CULTURE  URINALYSIS, ROUTINE W REFLEX MICROSCOPIC (NOT AT [facility])    Imaging Review No results found.   EKG Interpretation None      MDM   Final diagnoses:  None   CBC and be met showed no signs of renal failure leukocytosis. Patient to be placed on anti-bitoics follow-up with his urologist    Lacretia Leigh, MD 05/12/15 2246  Lacretia Leigh, MD 05/12/15 2330

## 2015-05-12 NOTE — ED Notes (Signed)
Pt states that he has been unable to urinate since 2pm; pt states that home health come out today and attempted to insert foley cath x 2 without success; pt states that the home health RN advised him to drink extra fluids and he has been; pt reports now he really needs to go urinate

## 2015-05-12 NOTE — Discharge Instructions (Signed)
Follow-up with your urologist tomorrow Acute Urinary Retention Acute urinary retention is the temporary inability to urinate. This is a common problem in older men. As men age their prostates become larger and block the flow of urine from the bladder. This is usually a problem that has come on gradually.  HOME CARE INSTRUCTIONS If you are sent home with a Foley catheter and a drainage system, you will need to discuss the best course of action with your health care provider. While the catheter is in, maintain a good intake of fluids. Keep the drainage bag emptied and lower than your catheter. This is so that contaminated urine will not flow back into your bladder, which could lead to a urinary tract infection. There are two main types of drainage bags. One is a large bag that usually is used at night. It has a good capacity that will allow you to sleep through the night without having to empty it. The second type is called a leg bag. It has a smaller capacity, so it needs to be emptied more frequently. However, the main advantage is that it can be attached by a leg strap and can go underneath your clothing, allowing you the freedom to move about or leave your home. Only take over-the-counter or prescription medicines for pain, discomfort, or fever as directed by your health care provider.  SEEK MEDICAL CARE IF:  You develop a low-grade fever.  You experience spasms or leakage of urine with the spasms. SEEK IMMEDIATE MEDICAL CARE IF:   You develop chills or fever.  Your catheter stops draining urine.  Your catheter falls out.  You start to develop increased bleeding that does not respond to rest and increased fluid intake. MAKE SURE YOU:  Understand these instructions.  Will watch your condition.  Will get help right away if you are not doing well or get worse. Document Released: 02/26/2001 Document Revised: 11/25/2013 Document Reviewed: 05/01/2013 [facility] Patient Information 2015  [employer], [place]. This information is not intended to replace advice given to you by your health care provider. Make sure you discuss any questions you have with your health care provider. Urinary Tract Infection Urinary tract infections (UTIs) can develop anywhere along your urinary tract. Your urinary tract is your body's drainage system for removing wastes and extra water. Your urinary tract includes two kidneys, two ureters, a bladder, and a urethra. Your kidneys are a pair of bean-shaped organs. Each kidney is about the size of your fist. They are located below your ribs, one on each side of your spine. CAUSES Infections are caused by microbes, which are microscopic organisms, including fungi, viruses, and bacteria. These organisms are so small that they can only be seen through a microscope. Bacteria are the microbes that most commonly cause UTIs. SYMPTOMS  Symptoms of UTIs may vary by age and gender of the patient and by the location of the infection. Symptoms in young women typically include a frequent and intense urge to urinate and a painful, burning feeling in the bladder or urethra during urination. Older women and men are more likely to be tired, shaky, and weak and have muscle aches and abdominal pain. A fever may mean the infection is in your kidneys. Other symptoms of a kidney infection include pain in your back or sides below the ribs, nausea, and vomiting. DIAGNOSIS To diagnose a UTI, your caregiver will ask you about your symptoms. Your caregiver also will ask to provide a urine sample. The urine sample will be tested  for bacteria and white blood cells. White blood cells are made by your body to help fight infection. TREATMENT  Typically, UTIs can be treated with medication. Because most UTIs are caused by a bacterial infection, they usually can be treated with the use of antibiotics. The choice of antibiotic and length of treatment depend on your symptoms and the type of bacteria causing  your infection. HOME CARE INSTRUCTIONS  If you were prescribed antibiotics, take them exactly as your caregiver instructs you. Finish the medication even if you feel better after you have only taken some of the medication.  Drink enough water and fluids to keep your urine clear or pale yellow.  Avoid caffeine, tea, and carbonated beverages. They tend to irritate your bladder.  Empty your bladder often. Avoid holding urine for long periods of time.  Empty your bladder before and after sexual intercourse.  After a bowel movement, women should cleanse from front to back. Use each tissue only once. SEEK MEDICAL CARE IF:   You have back pain.  You develop a fever.  Your symptoms do not begin to resolve within 3 days. SEEK IMMEDIATE MEDICAL CARE IF:   You have severe back pain or lower abdominal pain.  You develop chills.  You have nausea or vomiting.  You have continued burning or discomfort with urination. MAKE SURE YOU:   Understand these instructions.  Will watch your condition.  Will get help right away if you are not doing well or get worse. Document Released: 08/30/2005 Document Revised: 05/21/2012 Document Reviewed: 12/29/2011 [facility] Patient Information 2015 [place], [place]. This information is not intended to replace advice given to you by your health care provider. Make sure you discuss any questions you have with your health care provider.

## 2015-05-14 LAB — URINE CULTURE

## 2015-05-15 ENCOUNTER — Telehealth: Payer: Self-pay | Admitting: Emergency Medicine

## 2015-05-15 NOTE — Telephone Encounter (Signed)
Post ED Visit - Positive Culture Follow-up: Successful Patient Follow-Up  Culture assessed and recommendations reviewed by: []  Celedonio Miyamoto, Pharm.D., BCPS-AQ ID []  Georgina Pillion, Pharm.D., BCPS []  [place], [address].D., BCPS, AAHIVP []  Estella Husk, Pharm.D., BCPS, AAHIVP [x]  Tegan Magsam, Pharm.D. []  Tennis Must, [place].D.  Positive Urine culture  []  Patient discharged without antimicrobial prescription and treatment is now indicated [x]  Organism is resistant to prescribed ED discharge antimicrobial []  Patient with positive blood cultures  Changes discussed with ED provider: Dierdre Forth, PA New antibiotic prescription: Cipro 250 mg PO BID x three days Called to [facility] [phone]  Contacted patient, date 05/15/15, time 1700 pt returned call. ID verified. Patient notified of positive urine culture and need for antibiotic change. Instructed to stope Kelfex and start Bactrim. RX Bactrim called to [facility]   Jiles Harold 05/15/2015, 5:25 PM

## 2015-05-15 NOTE — Progress Notes (Signed)
ED Antimicrobial Stewardship Positive Culture Follow Up   Steve Lowery is an 81 y.o. male who presented to [facility] on 05/12/2015 with a chief complaint of urinary retention after having foley catheter removed 2 days prior.  Pt was discharged w/ Rx for Keflex for UTI and advised to f/u w/ his urologist.  Chief Complaint  Patient presents with  . Urinary Retention    Recent Results (from the past 720 hour(s))  Urine culture     Status: None   Collection Time: 05/12/15  9:50 PM  Result Value Ref Range Status   Specimen Description URINE, CATHETERIZED  Final   Special Requests NONE  Final   Colony Count   Final    >=100,000 COLONIES/ML Performed at [facility]    Culture   Final    PSEUDOMONAS AERUGINOSA Performed at [facility]    Report Status 05/14/2015 FINAL  Final   Organism ID, Bacteria PSEUDOMONAS AERUGINOSA  Final      Susceptibility   Pseudomonas aeruginosa - MIC*    CEFEPIME 2 SENSITIVE Sensitive     CEFTAZIDIME 4 SENSITIVE Sensitive     CIPROFLOXACIN <=0.25 SENSITIVE Sensitive     GENTAMICIN <=1 SENSITIVE Sensitive     IMIPENEM <=0.25 SENSITIVE Sensitive     PIP/TAZO 8 SENSITIVE Sensitive     TOBRAMYCIN <=1 SENSITIVE Sensitive     * PSEUDOMONAS AERUGINOSA    [x]  Treated with Keflex, organism resistant to prescribed antimicrobial   New antibiotic prescription: Stop Keflex Start Cipro 250mg  po BID x 3 days  ED Provider: Dierdre Forth, PA-C   Cherrell Maybee K 05/15/2015, 10:55 AM Infectious Diseases Pharmacist Phone# [phone]

## 2015-06-22 ENCOUNTER — Ambulatory Visit: Payer: Medicare Other | Attending: Physician Assistant | Admitting: Physical Therapy

## 2015-06-22 ENCOUNTER — Encounter: Payer: Self-pay | Admitting: Physical Therapy

## 2015-06-22 DIAGNOSIS — R6889 Other general symptoms and signs: Secondary | ICD-10-CM | POA: Insufficient documentation

## 2015-06-22 DIAGNOSIS — R2689 Other abnormalities of gait and mobility: Secondary | ICD-10-CM

## 2015-06-22 DIAGNOSIS — Z89512 Acquired absence of left leg below knee: Secondary | ICD-10-CM | POA: Diagnosis present

## 2015-06-22 DIAGNOSIS — R269 Unspecified abnormalities of gait and mobility: Secondary | ICD-10-CM | POA: Diagnosis not present

## 2015-06-22 DIAGNOSIS — R29818 Other symptoms and signs involving the nervous system: Secondary | ICD-10-CM | POA: Insufficient documentation

## 2015-06-22 DIAGNOSIS — R2681 Unsteadiness on feet: Secondary | ICD-10-CM | POA: Diagnosis present

## 2015-06-22 NOTE — Therapy (Signed)
[facility] [address] [place], [place], [address] Phone: [phone]   Fax:  [phone]  Physical Therapy Evaluation  Patient Details  Name: Steve Lowery MRN: [id number] Date of Birth: [birth year] Referring Provider:  Lucila Maine  Encounter Date: 06/22/2015      PT End of Session - 06/22/15 1615    Visit Number 1   Number of Visits 18   Date for PT Re-Evaluation 08/20/15   Authorization Type G-Code   PT Start Time 1616   PT Stop Time 1710   PT Time Calculation (min) 54 min   Equipment Utilized During Treatment Gait belt   Activity Tolerance Patient tolerated treatment well   Behavior During Therapy [facility] for tasks assessed/performed      Past Medical History  Diagnosis Date  . Diabetes mellitus   . Arthritis   . Kidney disease   . Hypertension     Past Surgical History  Procedure Laterality Date  . Tonsillectomy and adenoidectomy  1942  . Testicle surgery  1982    [facility]  . Amputation Left 03/04/2015    Procedure: AMPUTATION BELOW KNEE;  Surgeon: Cammy Copa, MD;  Location: [facility];  Service: Orthopedics;  Laterality: Left;    There were no vitals filed for this visit.  Visit Diagnosis:  Abnormality of gait  Unsteadiness  Balance problems  Decreased functional activity tolerance  Status post below knee amputation of left lower extremity      Subjective Assessment - 06/22/15 1627    Subjective He had infection in left foot that required a left Transtibial Amputation on 03/04/2015. He recieved his first prosthesis on 06/11/2015 and is dependent in use & care. He presents for PT evaluation.   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 Walk with prosthesis. He needs to be in first great-granddaughter.    Currently in Pain? No/denies            [facility] PT Assessment - 06/22/15 1615    Assessment   Medical Diagnosis Left Transtibial Amputation   Onset Date/Surgical Date 06/11/15  prosthetic delivery   Precautions   Precautions Fall   Restrictions   Weight Bearing Restrictions No   Balance Screen   Has the patient fallen in the past 6 months No   Has the patient had a decrease in activity level because of a fear of falling?  No   Is the patient reluctant to leave their home because of a fear of falling?  No   Home Environment   Living Environment Private residence   Living Arrangements Alone   Type of Home House   Home Access Level entry   Home Layout One level   Home Equipment Walker - 2 wheels;Cane - single point;Wheelchair - manual   Prior Function   Level of Independence Independent;Independent with gait;Independent with transfers   Vocation Retired;Volunteer work   Posture/Postural Control   Posture/Postural Control Postural limitations   Postural Limitations Forward head;Rounded Shoulders;Flexed trunk;Weight shift right   ROM / Strength   AROM / PROM / Strength AROM;Strength   AROM   Overall AROM  Within functional limits for tasks performed   Strength   Overall Strength Within functional limits for tasks performed   Transfers   Transfers Sit to Stand;Stand to Sit   Sit to Stand 5: Supervision;With upper extremity assist;With armrests;From  chair/3-in-1   Stand to Sit 5: Supervision;With upper extremity assist;With armrests;To chair/3-in-1   Ambulation/Gait   Ambulation/Gait Yes   Ambulation/Gait Assistance 5: Supervision   Ambulation Distance (Feet) 100 Feet   Assistive device Prosthesis;Rolling walker   Gait Pattern Step-through pattern;Narrow base of support;Trunk flexed;Decreased step length - right;Decreased stance time - left;Decreased stride length;Decreased weight shift to left   Ambulation Surface Indoor;Level   Gait velocity 1.37 ft/sec   Standardized Balance Assessment   Standardized  Balance Assessment Berg Balance Test   Berg Balance Test   Sit to Stand Able to stand  independently using hands   Standing Unsupported Able to stand 2 minutes with supervision   Sitting with Back Unsupported but Feet Supported on Floor or Stool Able to sit safely and securely 2 minutes   Stand to Sit Controls descent by using hands   Transfers Able to transfer safely, minor use of hands   Standing Unsupported with Eyes Closed Able to stand 10 seconds with supervision   Standing Ubsupported with Feet Together Able to place feet together independently but unable to hold for 30 seconds   From Standing, Reach Forward with Outstretched Arm Reaches forward but needs supervision   From Standing Position, Pick up Object from Floor Unable to pick up and needs supervision   From Standing Position, Turn to Look Behind Over each Shoulder Needs supervision when turning   Turn 360 Degrees Needs assistance while turning   Standing Unsupported, Alternately Place Feet on Step/Stool Needs assistance to keep from falling or unable to try   Standing Unsupported, One Foot in Front Needs help to step but can hold 15 seconds   Standing on One Leg Tries to lift leg/unable to hold 3 seconds but remains standing independently   Total Score 27         Prosthetics Assessment - 06/22/15 1615    Prosthetics   Prosthetic Care Dependent with Skin check;Residual limb care;Prosthetic cleaning;Ply sock cleaning;Correct ply sock adjustment;Proper wear schedule/adjustment;Proper weight-bearing schedule/adjustment;Other (comment)  donning   Donning prosthesis  Supervision   Doffing prosthesis  Supervision   Current prosthetic wear tolerance (days/week)  daily since delivery 11 days ago   Current prosthetic wear tolerance (#hours/day)  progressed to 6hrs   Current prosthetic weight-bearing tolerance (hours/day)  tolerated 8 minutes with RW support with no c/o pain or discomfort   Edema non-pitting   Residual limb condition   heat rash proximally, 2 invaginated areas laterally, no open areas   K code/activity level with prosthetic use  3                  [facility] Adult PT Treatment/Exercise - 06/22/15 1615    Prosthetics   Education Provided Skin check;Residual limb care;Prosthetic cleaning;Correct ply sock adjustment;Proper Donning;Proper wear schedule/adjustment;Proper weight-bearing schedule/adjustment   Person(s) Educated Patient   Education Method Explanation;Demonstration;Tactile cues;Verbal cues   Education Method Verbalized understanding;Returned demonstration;Tactile cues required;Verbal cues required;Needs further instruction                PT Education - 06/22/15 1615    Education provided Yes   Education Details how to load RW in car,    Person(s) Educated Patient   Methods Explanation   Comprehension Verbalized understanding          PT Short Term Goals - 06/22/15 1615    PT SHORT TERM GOAL #1   Title Patient tolerates wear >12hrs/day without skin issues or pain. (Target Date: 07/22/2015)   Time 1  Period Months   Status New   PT SHORT TERM GOAL #2   Title Patient ambulates 300' with single point cane & prosthesis with supervision.  (Target Date: 07/22/2015)   Time 1   Period Months   Status New   PT SHORT TERM GOAL #3   Title Patient negotiates ramps, curbs & stairs (1 rail) with single point cane & prosthesis with supervision.  (Target Date: 07/22/2015)   Time 1   Period Months   Status New   PT SHORT TERM GOAL #4   Title Berg Balance >40/56  (Target Date: 07/22/2015)   Time 1   Period Months   Status New           PT Long Term Goals - 06/26/2015 1615    PT LONG TERM GOAL #1   Title Patient verbalizes / demonstrates proper prosthetic care.  (Target Date: 08/20/2015)   Time 2   Period Months   Status New   PT LONG TERM GOAL #2   Title Patient tolerates >90% of awake hours without skin issues or discomfort.  (Target Date: 08/20/2015)   Time 2   Period  Months   Status New   PT LONG TERM GOAL #3   Title Patient ambulates >500' with LRAD & prosthesis modified independent.  (Target Date: 08/20/2015)   Time 2   Period Months   Status New   PT LONG TERM GOAL #4   Title Patient negotiates ramps, curbs, stairs with LRAD & prosthesis modified independent.  (Target Date: 08/20/2015)   Time 2   Period Months   Status New   PT LONG TERM GOAL #5   Title Berg Balance > 45/56  (Target Date: 08/20/2015)   Time 2   Period Months   Status New               Plan - 06/26/2015 1615    Clinical Impression Statement This 81yo was very active with no functional limitations prior to amputation. He recently recieved his first prosthesis and is dependent in use and care. He is limited in wear which limits functional activity level. Berg Balance indicates high fall risk. Gait is dependent on bilateral UE support of RW and he is unknowledgeable in barrier negotiation.                Pt will benefit from skilled therapeutic intervention in order to improve on the following deficits Abnormal gait;Decreased activity tolerance;Decreased balance;Decreased endurance;Decreased mobility;Decreased strength;Prosthetic Dependency;Postural dysfunction   Rehab Potential Good   PT Frequency 2x / week   PT Duration Other (comment)  9 weeks (60 days)   PT Treatment/Interventions ADLs/Self Care Home Management;Gait training;Stair training;Functional mobility training;Therapeutic activities;Therapeutic exercise;Balance training;Neuromuscular re-education;Patient/family education;Prosthetic Training   PT Next Visit Plan instruct in ramps, curbs, stairs (2 rails) with RW & prosthesis, review prosthetic care,    PT Home Exercise Plan HEP mid-line   Consulted and Agree with Plan of Care Patient          G-Codes - 2015/06/26 1615    Functional Assessment Tool Used Progressed wear to 6hrs too rapidly with heat rash on limb, dependent in prosthetic care,    Functional Limitation  Self care   Self Care Current Status (Z6109) At least 60 percent but less than 80 percent impaired, limited or restricted   Self Care Goal Status (U0454) At least 1 percent but less than 20 percent impaired, limited or restricted       Problem List Patient Active Problem  List   Diagnosis Date Noted  . Protein-calorie malnutrition, severe 03/06/2015  . Diabetic foot infection 03/05/2015  . Necrotizing fasciitis 03/04/2015  . CKD (chronic kidney disease) stage 4, GFR 15-29 ml/min 03/04/2015  . Charcot's joint of left foot 03/04/2015  . Hypokalemia 03/04/2015  . Leukocytosis 03/04/2015  . Pernicious anemia 03/10/2013  . Hyperlipidemia with target LDL less than 100 07/21/2012  . HTN (hypertension) 07/21/2012    Vladimir Faster PT, DPT 06/22/2015, 9:12 PM  [facility] [address] [place], [place], [address] Phone: [phone]   Fax:  [phone]

## 2015-06-24 ENCOUNTER — Ambulatory Visit: Payer: Medicare Other | Admitting: Physical Therapy

## 2015-06-24 ENCOUNTER — Encounter: Payer: Self-pay | Admitting: Physical Therapy

## 2015-06-24 DIAGNOSIS — R6889 Other general symptoms and signs: Secondary | ICD-10-CM

## 2015-06-24 DIAGNOSIS — R2681 Unsteadiness on feet: Secondary | ICD-10-CM

## 2015-06-24 DIAGNOSIS — R269 Unspecified abnormalities of gait and mobility: Secondary | ICD-10-CM | POA: Diagnosis not present

## 2015-06-24 DIAGNOSIS — Z89512 Acquired absence of left leg below knee: Secondary | ICD-10-CM

## 2015-06-24 DIAGNOSIS — R2689 Other abnormalities of gait and mobility: Secondary | ICD-10-CM

## 2015-06-24 NOTE — Therapy (Signed)
[facility] [address] [place], [place], [address] Phone: [phone]   Fax:  [phone]  Physical Therapy Treatment  Patient Details  Name: Steve Lowery MRN: [id number] Date of Birth: Apr 11, 1934 Referring Provider:  Lucila Maine  Encounter Date: 06/24/2015      PT End of Session - 06/24/15 1429    Visit Number 2   Number of Visits 18   Date for PT Re-Evaluation 08/20/15   Authorization Type G-Code   PT Start Time 1318   PT Stop Time 1406   PT Time Calculation (min) 48 min   Equipment Utilized During Treatment Gait belt   Activity Tolerance Patient tolerated treatment well   Behavior During Therapy [facility] for tasks assessed/performed      Past Medical History  Diagnosis Date  . Diabetes mellitus   . Arthritis   . Kidney disease   . Hypertension     Past Surgical History  Procedure Laterality Date  . Tonsillectomy and adenoidectomy  1942  . Testicle surgery  1982    Hydra Seal  . Amputation Left 03/04/2015    Procedure: AMPUTATION BELOW KNEE;  Surgeon: Cammy Copa, MD;  Location: [facility];  Service: Orthopedics;  Laterality: Left;    There were no vitals filed for this visit.  Visit Diagnosis:  Abnormality of gait  Unsteadiness  Balance problems  Decreased functional activity tolerance  Status post below knee amputation of left lower extremity      Subjective Assessment - 06/24/15 1327    Subjective No new complaints or falls to report. Denies pain.    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 Walk with prosthesis. He needs to be in first great-granddaughter.    Currently in Pain? No/denies          [facility] Adult PT Treatment/Exercise - 06/24/15 1330    Transfers   Transfers Sit to Stand;Stand to Sit   Sit to Stand 5:  Supervision;With upper extremity assist;With armrests;From chair/3-in-1   Stand to Sit 5: Supervision;With upper extremity assist;With armrests;To chair/3-in-1   Ambulation/Gait   Ambulation/Gait Yes   Ambulation/Gait Assistance 5: Supervision   Ambulation/Gait Assistance Details cues to increase step width; used tiles to provide visual cues to increase step width   Ambulation Distance (Feet) 400 Feet  400' x1, 115' indoors; 890' outdoors   Assistive device Prosthesis;Rolling walker   Gait Pattern Step-through pattern;Narrow base of support;Trunk flexed;Decreased step length - right;Decreased stance time - left;Decreased stride length;Decreased weight shift to left   Ambulation Surface Level;Unlevel;Indoor;Outdoor;Paved   Stairs Yes   Stairs Assistance 5: Supervision   Stairs Assistance Details (indicate cue type and reason) cues for technique, sequencing, and to get entire R foot on step when ascending   Stair Management Technique Two rails;Step to pattern;Forwards   Number of Stairs 4  x2   Ramp 4: Min assist   Ramp Details (indicate cue type and reason) cues for sequencing and technique; min assist when ascending ramp for balance due to slight L knee buckling; min guard when descending   Curb 4: Min assist   Curb Details (indicate cue type and reason) cues for sequencing and technique   Prosthetics   Current prosthetic wear tolerance (days/week)  daily   Current prosthetic wear tolerance (#hours/day)  4 hrs 2x a day (progressed to 6 hours)   Residual limb  condition  no heat rash; 2 invaginated areas laterally (cleaned proximal area to remove dirt/dead skin); no open areas   Education Provided Skin check;Residual limb care;Correct ply sock adjustment;Proper Donning;Proper wear schedule/adjustment   Person(s) Educated Patient   Education Method Explanation;Demonstration;Tactile cues;Verbal cues   Education Method Verbalized understanding;Returned demonstration;Tactile cues  required;Verbal cues required;Needs further instruction   Donning Prosthesis Minimal assist             PT Short Term Goals - 06/22/15 1615    PT SHORT TERM GOAL #1   Title Patient tolerates wear >12hrs/day without skin issues or pain. (Target Date: 07/22/2015)   Time 1   Period Months   Status New   PT SHORT TERM GOAL #2   Title Patient ambulates 300' with single point cane & prosthesis with supervision.  (Target Date: 07/22/2015)   Time 1   Period Months   Status New   PT SHORT TERM GOAL #3   Title Patient negotiates ramps, curbs & stairs (1 rail) with single point cane & prosthesis with supervision.  (Target Date: 07/22/2015)   Time 1   Period Months   Status New   PT SHORT TERM GOAL #4   Title Berg Balance >40/56  (Target Date: 07/22/2015)   Time 1   Period Months   Status New           PT Long Term Goals - 06/22/15 1615    PT LONG TERM GOAL #1   Title Patient verbalizes / demonstrates proper prosthetic care.  (Target Date: 08/20/2015)   Time 2   Period Months   Status New   PT LONG TERM GOAL #2   Title Patient tolerates >90% of awake hours without skin issues or discomfort.  (Target Date: 08/20/2015)   Time 2   Period Months   Status New   PT LONG TERM GOAL #3   Title Patient ambulates >500' with LRAD & prosthesis modified independent.  (Target Date: 08/20/2015)   Time 2   Period Months   Status New   PT LONG TERM GOAL #4   Title Patient negotiates ramps, curbs, stairs with LRAD & prosthesis modified independent.  (Target Date: 08/20/2015)   Time 2   Period Months   Status New   PT LONG TERM GOAL #5   Title Berg Balance > 45/56  (Target Date: 08/20/2015)   Time 2   Period Months   Status New               Plan - 06/24/15 1430    Clinical Impression Statement Pt is making significant progress toward all goals with ability to ambulate on indoor/outdoor surfaces and negotiate ramps, curbs, and stairs with minimal cueing and assistance during this  session. Pt experienced slight discomfort with ambulation that was resolved with addition of 1-ply sock. Plan to initiate gait training with straight cane next session.   Pt will benefit from skilled therapeutic intervention in order to improve on the following deficits Abnormal gait;Decreased activity tolerance;Decreased balance;Decreased endurance;Decreased mobility;Decreased strength;Prosthetic Dependency;Postural dysfunction   Rehab Potential Good   PT Frequency 2x / week   PT Duration Other (comment)  9 weeks (60 days)   PT Treatment/Interventions ADLs/Self Care Home Management;Gait training;Stair training;Functional mobility training;Therapeutic activities;Therapeutic exercise;Balance training;Neuromuscular re-education;Patient/family education;Prosthetic Training   PT Next Visit Plan ramps, curbs, stairs (2 rails) with RW & prosthesis, review prosthetic care, gait with straight cane    PT Home Exercise Plan HEP mid-line   Consulted and Agree with  Plan of Care Patient        Problem List Patient Active Problem List   Diagnosis Date Noted  . Protein-calorie malnutrition, severe 03/06/2015  . Diabetic foot infection 03/05/2015  . Necrotizing fasciitis 03/04/2015  . CKD (chronic kidney disease) stage 4, GFR 15-29 ml/min 03/04/2015  . Charcot's joint of left foot 03/04/2015  . Hypokalemia 03/04/2015  . Leukocytosis 03/04/2015  . Pernicious anemia 03/10/2013  . Hyperlipidemia with target LDL less than 100 07/21/2012  . HTN (hypertension) 07/21/2012    Leonard Schwartz 06/24/2015, 2:43 PM  [place], [place]  Sallyanne Kuster, [place], [facility] [address], [address] [place], [place] [address] [phone] 06/25/2015, 1:48 PM

## 2015-06-29 ENCOUNTER — Encounter: Payer: Self-pay | Admitting: Physical Therapy

## 2015-06-29 ENCOUNTER — Ambulatory Visit: Payer: Medicare Other | Admitting: Physical Therapy

## 2015-06-29 DIAGNOSIS — R269 Unspecified abnormalities of gait and mobility: Secondary | ICD-10-CM

## 2015-06-29 DIAGNOSIS — R2681 Unsteadiness on feet: Secondary | ICD-10-CM

## 2015-06-29 DIAGNOSIS — Z89512 Acquired absence of left leg below knee: Secondary | ICD-10-CM

## 2015-06-29 DIAGNOSIS — R6889 Other general symptoms and signs: Secondary | ICD-10-CM

## 2015-06-29 DIAGNOSIS — R2689 Other abnormalities of gait and mobility: Secondary | ICD-10-CM

## 2015-06-30 ENCOUNTER — Ambulatory Visit: Payer: Medicare Other | Admitting: Physical Therapy

## 2015-06-30 ENCOUNTER — Encounter: Payer: Self-pay | Admitting: Physical Therapy

## 2015-06-30 DIAGNOSIS — R2681 Unsteadiness on feet: Secondary | ICD-10-CM

## 2015-06-30 DIAGNOSIS — R2689 Other abnormalities of gait and mobility: Secondary | ICD-10-CM

## 2015-06-30 DIAGNOSIS — R6889 Other general symptoms and signs: Secondary | ICD-10-CM

## 2015-06-30 DIAGNOSIS — R269 Unspecified abnormalities of gait and mobility: Secondary | ICD-10-CM

## 2015-06-30 DIAGNOSIS — Z89512 Acquired absence of left leg below knee: Secondary | ICD-10-CM

## 2015-06-30 NOTE — Therapy (Signed)
[facility] [address] [place], [place], [address] Phone: [phone]   Fax:  [phone]  Physical Therapy Treatment  Patient Details  Name: Steve Lowery MRN: [id number] Date of Birth: 1934-05-22 Referring Provider:  Lucila Maine  Encounter Date: 06/29/2015      PT End of Session - 06/29/15 1230    Visit Number 3   Number of Visits 18   Date for PT Re-Evaluation 08/20/15   Authorization Type G-Code   PT Start Time 1315   PT Stop Time 1400   PT Time Calculation (min) 45 min   Equipment Utilized During Treatment Gait belt   Activity Tolerance Patient tolerated treatment well   Behavior During Therapy [facility] for tasks assessed/performed      Past Medical History  Diagnosis Date  . Diabetes mellitus   . Arthritis   . Kidney disease   . Hypertension     Past Surgical History  Procedure Laterality Date  . Tonsillectomy and adenoidectomy  1942  . Testicle surgery  1982    Hydra Seal  . Amputation Left 03/04/2015    Procedure: AMPUTATION BELOW KNEE;  Surgeon: Cammy Copa, MD;  [facility];  Service: Orthopedics;  Laterality: Left;    There were no vitals filed for this visit.  Visit Diagnosis:  Abnormality of gait  Unsteadiness  Balance problems  Decreased functional activity tolerance  Status post below knee amputation of left lower extremity      Subjective Assessment - 06/29/15 1312    Subjective No issues with leg. Patient ambulating with RW in community without issues.    Currently in Pain? No/denies                         [facility] Adult PT Treatment/Exercise - 06/29/15 1315    Transfers   Transfers Sit to Stand;Stand to Sit   Sit to Stand 5: Supervision;With upper extremity assist;From chair/3-in-1   Sit to Stand Details Verbal cues for precautions/safety   Stand to Sit 5: Supervision;With upper extremity assist;To chair/3-in-1   Stand to Sit Details  (indicate cue type and reason) Verbal cues for technique   Ambulation/Gait   Ambulation/Gait Yes   Ambulation/Gait Assistance 5: Supervision;6: Modified independent (Device/Increase time)  supervision with cane & Modified indep RW   Ambulation/Gait Assistance Details cues on sequence with cane, posture & step width   Ambulation Distance (Feet) 500 Feet  500 X 2   Assistive device Prosthesis;Rolling walker;Straight cane   Gait Pattern Step-through pattern;Narrow base of support;Trunk flexed;Decreased step length - right;Decreased stance time - left;Decreased stride length;Decreased weight shift to left   Ambulation Surface Indoor;Level   Stairs Yes   Stairs Assistance 5: Supervision   Stairs Assistance Details (indicate cue type and reason) PT demo, cued on reciprocal technique with prosthesis   Stair Management Technique Two rails;Forwards;Alternating pattern;One rail Right;With cane  initially 2 rails, progressed to 1 rail & cane   Number of Stairs 4  X 5 reps   Ramp 4: Min assist  SPC & prosthesis   Ramp Details (indicate cue type and reason) demo, instructed in technique with cane   Curb 4: Min assist  [facility] & prosthesis   Curb Details (indicate cue type and reason) demo, instructed in technique including step thru with second LE   Balance   Balance Assessed Yes   Dynamic Standing Balance   Dynamic Standing - Balance Support No upper extremity supported;Bilateral  upper extremity supported;Left upper extremity supported;Right upper extremity supported;During functional activity   Dynamic Standing - Level of Assistance 5: Stand by assistance   Foam balance beam comments: head movements, alternate stepping off forward, backward & sideways: initially with BUEs progressed to 1 UE   Eyes open comments: standing on floor & foam beam, head movements 4 directions  floor no UE support and on foam beam, initial BUE, then 1 UE   Eyes closed comments: static stance up to 10 seconds on floor &  foam beam  on floor no UE support 10", on foam 3-7" with contact assist   High Level Balance   High Level Balance Activities Side stepping;Backward walking;Other (comment)  In parallel bars w/ light UE support, + ball rolling SLS   High Level Balance Comments cues on prosthesis control and righting reactions.   Prosthetics   Current prosthetic wear tolerance (days/week)  daily   Current prosthetic wear tolerance (#hours/day)  PT increased to all awake hrs except 2 hrs mid-day & dry 1/2 way thru each wear   Residual limb condition  light heat rash; 2 invaginated areas lateral now clean; no open areas   Education Provided Skin check;Residual limb care;Correct ply sock adjustment;Proper wear schedule/adjustment   Person(s) Educated Patient   Education Method Explanation;Demonstration;Verbal cues   Education Method Verbalized understanding;Returned demonstration;Verbal cues required;Needs further instruction   Donning Prosthesis Supervision                  PT Short Term Goals - 06/22/15 1615    PT SHORT TERM GOAL #1   Title Patient tolerates wear >12hrs/day without skin issues or pain. (Target Date: 07/22/2015)   Time 1   Period Months   Status New   PT SHORT TERM GOAL #2   Title Patient ambulates 300' with single point cane & prosthesis with supervision.  (Target Date: 07/22/2015)   Time 1   Period Months   Status New   PT SHORT TERM GOAL #3   Title Patient negotiates ramps, curbs & stairs (1 rail) with single point cane & prosthesis with supervision.  (Target Date: 07/22/2015)   Time 1   Period Months   Status New   PT SHORT TERM GOAL #4   Title Berg Balance >40/56  (Target Date: 07/22/2015)   Time 1   Period Months   Status New           PT Long Term Goals - 06/22/15 1615    PT LONG TERM GOAL #1   Title Patient verbalizes / demonstrates proper prosthetic care.  (Target Date: 08/20/2015)   Time 2   Period Months   Status New   PT LONG TERM GOAL #2   Title  Patient tolerates >90% of awake hours without skin issues or discomfort.  (Target Date: 08/20/2015)   Time 2   Period Months   Status New   PT LONG TERM GOAL #3   Title Patient ambulates >500' with LRAD & prosthesis modified independent.  (Target Date: 08/20/2015)   Time 2   Period Months   Status New   PT LONG TERM GOAL #4   Title Patient negotiates ramps, curbs, stairs with LRAD & prosthesis modified independent.  (Target Date: 08/20/2015)   Time 2   Period Months   Status New   PT LONG TERM GOAL #5   Title Berg Balance > 45/56  (Target Date: 08/20/2015)   Time 2   Period Months   Status New  Plan - 06/29/15 1315    Clinical Impression Statement Patient was able to progress balance with activities in parallel bars. Patient appears safe to use cane for limited distances that no ramps or curbs are involved. He is safe at community level with RW.    Pt will benefit from skilled therapeutic intervention in order to improve on the following deficits Abnormal gait;Decreased activity tolerance;Decreased balance;Decreased endurance;Decreased mobility;Decreased strength;Prosthetic Dependency;Postural dysfunction   Rehab Potential Good   PT Frequency 2x / week   PT Duration Other (comment)  9 weeks (60 days)   PT Treatment/Interventions ADLs/Self Care Home Management;Gait training;Stair training;Functional mobility training;Therapeutic activities;Therapeutic exercise;Balance training;Neuromuscular re-education;Patient/family education;Prosthetic Training   PT Next Visit Plan ramps, curbs, stairs (2 rails) with cane & prosthesis, review prosthetic care,   PT Home Exercise Plan HEP mid-line   Consulted and Agree with Plan of Care Patient        Problem List Patient Active Problem List   Diagnosis Date Noted  . Protein-calorie malnutrition, severe 03/06/2015  . Diabetic foot infection 03/05/2015  . Necrotizing fasciitis 03/04/2015  . CKD (chronic kidney disease) stage  4, GFR 15-29 ml/min 03/04/2015  . Charcot's joint of left foot 03/04/2015  . Hypokalemia 03/04/2015  . Leukocytosis 03/04/2015  . Pernicious anemia 03/10/2013  . Hyperlipidemia with target LDL less than 100 07/21/2012  . HTN (hypertension) 07/21/2012    Vladimir Faster PT, DPT 06/30/2015, 9:12 AM  [facility] [address] [place], [place], [address] Phone: [phone]   Fax:  [phone]

## 2015-06-30 NOTE — Therapy (Signed)
[facility] [address] [place], [place], [address] Phone: [phone]   Fax:  [phone]  Physical Therapy Treatment  Patient Details  Name: Steve Lowery MRN: [id number] Date of Birth: [birth year] Referring Provider:  Selinda Orion  Encounter Date: 06/30/2015      PT End of Session - 06/30/15 1108    Visit Number 4   Number of Visits 18   Date for PT Re-Evaluation 08/20/15   Authorization Type G-Code   PT Start Time 1106   PT Stop Time 1145   PT Time Calculation (min) 39 min   Equipment Utilized During Treatment Gait belt   Activity Tolerance Patient tolerated treatment well   Behavior During Therapy [facility] for tasks assessed/performed      Past Medical History  Diagnosis Date  . Diabetes mellitus   . Arthritis   . Kidney disease   . Hypertension     Past Surgical History  Procedure Laterality Date  . Tonsillectomy and adenoidectomy  1942  . Testicle surgery  1982    Hydra Seal  . Amputation Left 03/04/2015    Procedure: AMPUTATION BELOW KNEE;  Surgeon: Meredith Pel, MD;  Location: [facility];  Service: Orthopedics;  Laterality: Left;    There were no vitals filed for this visit.  Visit Diagnosis:  Abnormality of gait  Unsteadiness  Balance problems  Decreased functional activity tolerance  Status post below knee amputation of left lower extremity      Subjective Assessment - 06/30/15 1109    Subjective Pt complains of not feeling too well today. Pt ambulated into therapy with SPC with no issues. No falls to report. Denies pain.    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 Walk with prosthesis. He needs to be in first great-granddaughter.    Currently in Pain? No/denies           [facility] Adult PT Treatment/Exercise - 06/30/15 1111    Transfers   Transfers Sit to Stand;Stand to Sit   Sit to Stand 6: Modified independent (Device/Increase time);With upper extremity assist;From chair/3-in-1   Stand to Sit 6: Modified independent (Device/Increase time);With upper extremity assist;To chair/3-in-1   Ambulation/Gait   Ambulation/Gait Yes   Ambulation/Gait Assistance 5: Supervision   Ambulation/Gait Assistance Details cues to increase step width   Ambulation Distance (Feet) 400 Feet   Assistive device Straight cane;Prosthesis   Gait Pattern Step-through pattern;Narrow base of support;Trunk flexed;Decreased step length - right;Decreased stance time - left;Decreased stride length;Decreased weight shift to left   Ambulation Surface Level;Indoor   Stairs Yes   Stairs Assistance 5: Supervision   Stairs Assistance Details (indicate cue type and reason) cues for foot positioning   Stair Management Technique Two rails;One rail Left;Alternating pattern;Forwards;With cane   Number of Stairs 4  x2 reps   Ramp 4: Min assist   Ramp Details (indicate cue type and reason) cues for sequencing and step length   Curb 4: Min assist   Curb Details (indicate cue type and reason) cues for sequencing and technique   Prosthetics   Current prosthetic wear tolerance (days/week)  daily   Current prosthetic wear tolerance (#hours/day)  all awake hrs except 2 hrs mid-day & dry 1/2 way thru each wear   Residual limb condition  light heat rash still present, but not worse   Education Provided Residual limb care;Correct  ply sock adjustment;Proper wear schedule/adjustment   Person(s) Educated Patient   Education Method Explanation   Education Method Verbalized understanding   Donning Prosthesis Supervision     Balance training in parallel bars (min guard for safety; cues for posture, sequencing, and technique): -Marching fwd/bwd, side stepping x3 laps each ex (B UE support with occasional single UE support) -On air ex pad with B UE support: DLS eyes  closed 3J62 sec; alt stepping fwd/bwd 2x20 each ex; alt stepping out to side 2x20        PT Short Term Goals - 06/30/15 1436    PT SHORT TERM GOAL #1   Title Patient tolerates wear >12hrs/day without skin issues or pain. (Target Date: 07/22/2015)   Time 1   Period Months   Status New   PT SHORT TERM GOAL #2   Title Patient ambulates 300' with single point cane & prosthesis with supervision.  (Target Date: 07/22/2015)   Baseline Met 06/30/15   Time 1   Period Months   Status Achieved   PT SHORT TERM GOAL #3   Title Patient negotiates ramps, curbs & stairs (1 rail) with single point cane & prosthesis with supervision.  (Target Date: 07/22/2015)   Time 1   Period Months   Status New   PT SHORT TERM GOAL #4   Title Berg Balance >40/56  (Target Date: 07/22/2015)   Time 1   Period Months   Status New           PT Long Term Goals - 06/22/15 1615    PT LONG TERM GOAL #1   Title Patient verbalizes / demonstrates proper prosthetic care.  (Target Date: 08/20/2015)   Time 2   Period Months   Status New   PT LONG TERM GOAL #2   Title Patient tolerates >90% of awake hours without skin issues or discomfort.  (Target Date: 08/20/2015)   Time 2   Period Months   Status New   PT LONG TERM GOAL #3   Title Patient ambulates >500' with LRAD & prosthesis modified independent.  (Target Date: 08/20/2015)   Time 2   Period Months   Status New   PT LONG TERM GOAL #4   Title Patient negotiates ramps, curbs, stairs with LRAD & prosthesis modified independent.  (Target Date: 08/20/2015)   Time 2   Period Months   Status New   PT LONG TERM GOAL #5   Title Berg Balance > 45/56  (Target Date: 08/20/2015)   Time 2   Period Months   Status New           Plan - 06/30/15 1437    Clinical Impression Statement Pt continuing to make significant progress with mobility with [facility] and has met STG 2 for ambulation with SPC. Continue to progress balance activities in parallel bars and focus on ramp/curb  with SPC. Making progress toward goals.   Pt will benefit from skilled therapeutic intervention in order to improve on the following deficits Abnormal gait;Decreased activity tolerance;Decreased balance;Decreased endurance;Decreased mobility;Decreased strength;Prosthetic Dependency;Postural dysfunction   Rehab Potential Good   PT Frequency 2x / week   PT Duration Other (comment)  9 weeks (60 days)   PT Treatment/Interventions ADLs/Self Care Home Management;Gait training;Stair training;Functional mobility training;Therapeutic activities;Therapeutic exercise;Balance training;Neuromuscular re-education;Patient/family education;Prosthetic Training   PT Next Visit Plan ramps, curbs, stairs (1 rail) with cane & prosthesis, review prosthetic care, balance activities in parallel bars   PT Home Exercise Plan HEP mid-line   Consulted and  Agree with Plan of Care Patient        Problem List Patient Active Problem List   Diagnosis Date Noted  . Protein-calorie malnutrition, severe 03/06/2015  . Diabetic foot infection 03/05/2015  . Necrotizing fasciitis 03/04/2015  . CKD (chronic kidney disease) stage 4, GFR 15-29 ml/min 03/04/2015  . Charcot's joint of left foot 03/04/2015  . Hypokalemia 03/04/2015  . Leukocytosis 03/04/2015  . Pernicious anemia 03/10/2013  . Hyperlipidemia with target LDL less than 100 07/21/2012  . HTN (hypertension) 07/21/2012    Rubye Oaks 06/30/2015, 2:46 PM  Rubye Oaks, [place] [address] [place], [place], [address] Phone: [phone]   Fax:  [phone]

## 2015-07-06 ENCOUNTER — Encounter: Payer: Self-pay | Admitting: Physical Therapy

## 2015-07-06 ENCOUNTER — Ambulatory Visit: Payer: Medicare Other | Attending: Physician Assistant | Admitting: Physical Therapy

## 2015-07-06 DIAGNOSIS — R2681 Unsteadiness on feet: Secondary | ICD-10-CM | POA: Diagnosis present

## 2015-07-06 DIAGNOSIS — R269 Unspecified abnormalities of gait and mobility: Secondary | ICD-10-CM | POA: Diagnosis present

## 2015-07-06 DIAGNOSIS — R2689 Other abnormalities of gait and mobility: Secondary | ICD-10-CM

## 2015-07-06 DIAGNOSIS — R29818 Other symptoms and signs involving the nervous system: Secondary | ICD-10-CM | POA: Diagnosis present

## 2015-07-06 DIAGNOSIS — R6889 Other general symptoms and signs: Secondary | ICD-10-CM | POA: Diagnosis present

## 2015-07-06 DIAGNOSIS — Z89512 Acquired absence of left leg below knee: Secondary | ICD-10-CM | POA: Diagnosis present

## 2015-07-07 NOTE — Therapy (Signed)
[facility] [address] [place], [place], [address] Phone: [phone]   Fax:  [phone]  Physical Therapy Treatment  Patient Details  Name: Steve Lowery MRN: [id number] Date of Birth: 15-Feb-1934 Referring Provider:  Aura Dials, PA-C  Encounter Date: 07/06/2015      PT End of Session - 07/06/15 1315    Visit Number 5   Number of Visits 18   Date for PT Re-Evaluation 08/20/15   Authorization Type G-Code   PT Start Time 1315   PT Stop Time 1400   PT Time Calculation (min) 45 min   Equipment Utilized During Treatment Gait belt   Activity Tolerance Patient tolerated treatment well   Behavior During Therapy [facility] for tasks assessed/performed      Past Medical History  Diagnosis Date  . Diabetes mellitus   . Arthritis   . Kidney disease   . Hypertension     Past Surgical History  Procedure Laterality Date  . Tonsillectomy and adenoidectomy  1942  . Testicle surgery  1982    [facility] Seal  . Amputation Left 03/04/2015    Procedure: AMPUTATION BELOW KNEE;  Surgeon: Meredith Pel, MD;  Location: [facility];  Service: Orthopedics;  Laterality: Left;    There were no vitals filed for this visit.  Visit Diagnosis:  Abnormality of gait  Unsteadiness  Balance problems  Decreased functional activity tolerance  Status post below knee amputation of left lower extremity      Subjective Assessment - 07/06/15 1318    Subjective no falls. no issues. wearing prosthesis all awake hours except ~1.5hrs mid-day without issues.    Currently in Pain? No/denies                         [facility] Adult PT Treatment/Exercise - 07/06/15 1315    Transfers   Transfers Sit to Stand;Stand to Sit   Sit to Stand 6: Modified independent (Device/Increase time);With upper extremity assist;From chair/3-in-1  without support to stabilize   Stand to Sit 6: Modified independent (Device/Increase time);With upper  extremity assist;To chair/3-in-1   Ambulation/Gait   Ambulation/Gait Yes   Ambulation/Gait Assistance 5: Supervision   Ambulation/Gait Assistance Details worked on scanning / turning head while walking and maintaining path & speed   Ambulation Distance (Feet) 600 Feet   Assistive device Prosthesis   Gait Pattern Step-through pattern;Narrow base of support;Trunk flexed;Decreased step length - right;Decreased stance time - left;Decreased stride length;Decreased weight shift to left   Ambulation Surface Indoor;Level   Stairs Yes   Stairs Assistance 5: Supervision   Stairs Assistance Details (indicate cue type and reason) demo & verbal cues on wt shift first then straighten knee   Stair Management Technique Two rails;One rail Left;Alternating pattern;Forwards   Number of Stairs 4  x 8 reps   Ramp 4: Min assist;5: Supervision  prosthesis only   Ramp Details (indicate cue type and reason) cues on posture & wt shift   Curb 4: Min assist  prosthesis only   Curb Details (indicate cue type and reason) demo, verbal cues on technique,foot position & stepping thru with 2nd LE   High Level Balance   High Level Balance Activities Side stepping;Braiding;Backward walking;Head turns;Tandem walking;Marching forwards;Marching backwards   High Level Balance Comments cues on prosthesis control and righting reactions.   Prosthetics   Prosthetic Care Comments  next session increase to all awake hrs with drying limb/liner q4 hrs, prn   Current prosthetic  wear tolerance (days/week)  daily   Current prosthetic wear tolerance (#hours/day)  all awake hrs except 1 hrs mid-day & dry 1/2 way thru each wear   Residual limb condition  light heat rash still present, but not worse   Education Provided Residual limb care;Correct ply sock adjustment;Proper wear schedule/adjustment   Person(s) Educated Patient   Education Method Explanation;Demonstration;Verbal cues   Education Method Verbalized understanding;Returned  demonstration;Verbal cues required;Needs further instruction   Donning Prosthesis Supervision                  PT Short Term Goals - 06/30/15 1436    PT SHORT TERM GOAL #1   Title Patient tolerates wear >12hrs/day without skin issues or pain. (Target Date: 07/22/2015)   Time 1   Period Months   Status New   PT SHORT TERM GOAL #2   Title Patient ambulates 300' with single point cane & prosthesis with supervision.  (Target Date: 07/22/2015)   Baseline Met 06/30/15   Time 1   Period Months   Status Achieved   PT SHORT TERM GOAL #3   Title Patient negotiates ramps, curbs & stairs (1 rail) with single point cane & prosthesis with supervision.  (Target Date: 07/22/2015)   Time 1   Period Months   Status New   PT SHORT TERM GOAL #4   Title Berg Balance >40/56  (Target Date: 07/22/2015)   Time 1   Period Months   Status New           PT Long Term Goals - 06/22/15 1615    PT LONG TERM GOAL #1   Title Patient verbalizes / demonstrates proper prosthetic care.  (Target Date: 08/20/2015)   Time 2   Period Months   Status New   PT LONG TERM GOAL #2   Title Patient tolerates >90% of awake hours without skin issues or discomfort.  (Target Date: 08/20/2015)   Time 2   Period Months   Status New   PT LONG TERM GOAL #3   Title Patient ambulates >500' with LRAD & prosthesis modified independent.  (Target Date: 08/20/2015)   Time 2   Period Months   Status New   PT LONG TERM GOAL #4   Title Patient negotiates ramps, curbs, stairs with LRAD & prosthesis modified independent.  (Target Date: 08/20/2015)   Time 2   Period Months   Status New   PT LONG TERM GOAL #5   Title Berg Balance > 45/56  (Target Date: 08/20/2015)   Time 2   Period Months   Status New               Plan - 07/06/15 1315    Clinical Impression Statement Patient improved his ability to maintain his path while turning his head to scan; he had greater issues with diagonal head movements over straight  plane. Patient improved ability to negotiate curbs with instruction & repetition.                                       Pt will benefit from skilled therapeutic intervention in order to improve on the following deficits Abnormal gait;Decreased activity tolerance;Decreased balance;Decreased endurance;Decreased mobility;Decreased strength;Prosthetic Dependency;Postural dysfunction   Rehab Potential Good   PT Frequency 2x / week   PT Duration Other (comment)  9 weeks (60 days)   PT Treatment/Interventions ADLs/Self Care Home Management;Gait training;Stair training;Functional mobility  training;Therapeutic activities;Therapeutic exercise;Balance training;Neuromuscular re-education;Patient/family education;Prosthetic Training   PT Next Visit Plan gait with head turns, ramps, curbs, stairs (1 rail) prosthesis, review prosthetic care,   Consulted and Agree with Plan of Care Patient        Problem List Patient Active Problem List   Diagnosis Date Noted  . Protein-calorie malnutrition, severe 03/06/2015  . Diabetic foot infection 03/05/2015  . Necrotizing fasciitis 03/04/2015  . CKD (chronic kidney disease) stage 4, GFR 15-29 ml/min 03/04/2015  . Charcot's joint of left foot 03/04/2015  . Hypokalemia 03/04/2015  . Leukocytosis 03/04/2015  . Pernicious anemia 03/10/2013  . Hyperlipidemia with target LDL less than 100 07/21/2012  . HTN (hypertension) 07/21/2012    Jamey Reas PT, DPT 07/07/2015, 7:46 AM  [facility] [address] [place], [place], [address] Phone: [phone]   Fax:  [phone]

## 2015-07-08 ENCOUNTER — Encounter: Payer: Self-pay | Admitting: Physical Therapy

## 2015-07-08 ENCOUNTER — Ambulatory Visit: Payer: Medicare Other | Admitting: Physical Therapy

## 2015-07-08 DIAGNOSIS — R269 Unspecified abnormalities of gait and mobility: Secondary | ICD-10-CM

## 2015-07-08 DIAGNOSIS — R2681 Unsteadiness on feet: Secondary | ICD-10-CM

## 2015-07-08 DIAGNOSIS — R6889 Other general symptoms and signs: Secondary | ICD-10-CM

## 2015-07-08 DIAGNOSIS — R2689 Other abnormalities of gait and mobility: Secondary | ICD-10-CM

## 2015-07-09 NOTE — Therapy (Signed)
[facility] [address] [place], [place], [address] Phone: [phone]   Fax:  [phone]  Physical Therapy Treatment  Patient Details  Name: Steve Lowery MRN: [id number] Date of Birth: 08/09/1934 Referring Provider:  Selinda Orion  Encounter Date: 07/08/2015      PT End of Session - 07/08/15 1408    Visit Number 6   Number of Visits 18   Date for PT Re-Evaluation 08/20/15   Authorization Type G-Code   PT Start Time 1402   PT Stop Time 1445   PT Time Calculation (min) 43 min   Equipment Utilized During Treatment Gait belt   Activity Tolerance Patient tolerated treatment well   Behavior During Therapy [facility] for tasks assessed/performed      Past Medical History  Diagnosis Date  . Diabetes mellitus   . Arthritis   . Kidney disease   . Hypertension     Past Surgical History  Procedure Laterality Date  . Tonsillectomy and adenoidectomy  1942  . Testicle surgery  1982    [facility] Seal  . Amputation Left 03/04/2015    Procedure: AMPUTATION BELOW KNEE;  Surgeon: Meredith Pel, MD;  Location: [facility];  Service: Orthopedics;  Laterality: Left;    There were no vitals filed for this visit.  Visit Diagnosis:  Abnormality of gait  Unsteadiness  Balance problems  Decreased functional activity tolerance      Subjective Assessment - 07/08/15 1407    Subjective No falls or pain to report. To clinic today without any AD. Has signed up at [facility] and goes for first session tomorrow with personal traininer (unsure if thats is for tomorrow only or not).   Currently in Pain? No/denies           [facility] [place] Adult PT Treatment/Exercise - 07/08/15 1409    Transfers   Sit to Stand 6: Modified independent (Device/Increase time);With upper extremity assist;From chair/3-in-1   Stand to Sit 6: Modified independent (Device/Increase time);With upper extremity assist;To chair/3-in-1   Ambulation/Gait   Ambulation/Gait Yes   Ambulation/Gait Assistance 5: Supervision;4: Min guard   Ambulation/Gait Assistance Details no balance loss, had pt scan enviroment             Ambulation Distance (Feet) 1000 Feet  x 1   Assistive device Prosthesis   Gait Pattern Step-through pattern;Decreased stride length;Narrow base of support   Ambulation Surface Level;Unlevel;Indoor;Outdoor;Paved;Gravel;Grass   Stairs Yes   Stairs Assistance 5: Supervision   Stairs Assistance Details (indicate cue type and reason) no cues or assist needed. increased time with ascending to raise self up, increased time with descending to ensure prosthetic foot placement to allow knee to bend                      Stair Management Technique One rail Right;One rail Left;Two rails;Alternating pattern;Forwards   Number of Stairs 4  x 3 reps   Ramp 5: Supervision   Ramp Details (indicate cue type and reason) cues on step length and posture   Curb 5: Supervision   Curb Details (indicate cue type and reason) cues on posture and foot placement for balance                               Prosthetics   Prosthetic Care Comments  edcuated on use of deodorant and baby oil to assist with sweating and heat rash.  Current prosthetic wear tolerance (days/week)  daily   Current prosthetic wear tolerance (#hours/day)  all awake hours  drying every 3 hours   Residual limb condition  light heat rash still present, but not worse   Education Provided Residual limb care   Person(s) Educated Patient   Education Method Explanation   Education Method Verbalized understanding   Donning Prosthesis Supervision   Doffing Prosthesis Supervision           PT Short Term Goals - 06/30/15 1436    PT SHORT TERM GOAL #1   Title Patient tolerates wear >12hrs/day without skin issues or pain. (Target Date: 07/22/2015)   Time 1   Period Months   Status New   PT SHORT TERM GOAL #2   Title Patient ambulates 300' with single point  cane & prosthesis with supervision.  (Target Date: 07/22/2015)   Baseline Met 06/30/15   Time 1   Period Months   Status Achieved   PT SHORT TERM GOAL #3   Title Patient negotiates ramps, curbs & stairs (1 rail) with single point cane & prosthesis with supervision.  (Target Date: 07/22/2015)   Time 1   Period Months   Status New   PT SHORT TERM GOAL #4   Title Berg Balance >40/56  (Target Date: 07/22/2015)   Time 1   Period Months   Status New           PT Long Term Goals - 06/22/15 1615    PT LONG TERM GOAL #1   Title Patient verbalizes / demonstrates proper prosthetic care.  (Target Date: 08/20/2015)   Time 2   Period Months   Status New   PT LONG TERM GOAL #2   Title Patient tolerates >90% of awake hours without skin issues or discomfort.  (Target Date: 08/20/2015)   Time 2   Period Months   Status New   PT LONG TERM GOAL #3   Title Patient ambulates >500' with LRAD & prosthesis modified independent.  (Target Date: 08/20/2015)   Time 2   Period Months   Status New   PT LONG TERM GOAL #4   Title Patient negotiates ramps, curbs, stairs with LRAD & prosthesis modified independent.  (Target Date: 08/20/2015)   Time 2   Period Months   Status New   PT LONG TERM GOAL #5   Title Berg Balance > 45/56  (Target Date: 08/20/2015)   Time 2   Period Months   Status New               Plan - 07/08/15 1408    Clinical Impression Statement Pt is making great progress with mobility and prosthetic use.    Pt will benefit from skilled therapeutic intervention in order to improve on the following deficits Abnormal gait;Decreased activity tolerance;Decreased balance;Decreased endurance;Decreased mobility;Decreased strength;Prosthetic Dependency;Postural dysfunction   Rehab Potential Good   PT Frequency 2x / week   PT Duration Other (comment)  9 weeks (60 days)   PT Treatment/Interventions ADLs/Self Care Home Management;Gait training;Stair training;Functional mobility  training;Therapeutic activities;Therapeutic exercise;Balance training;Neuromuscular re-education;Patient/family education;Prosthetic Training   PT Next Visit Plan assess goals as pt plans to go to [place] at end of next week for several months   Consulted and Agree with Plan of Care Patient        Problem List Patient Active Problem List   Diagnosis Date Noted  . Protein-calorie malnutrition, severe 03/06/2015  . Diabetic foot infection 03/05/2015  . Necrotizing fasciitis 03/04/2015  .  CKD (chronic kidney disease) stage 4, GFR 15-29 ml/min 03/04/2015  . Charcot's joint of left foot 03/04/2015  . Hypokalemia 03/04/2015  . Leukocytosis 03/04/2015  . Pernicious anemia 03/10/2013  . Hyperlipidemia with target LDL less than 100 07/21/2012  . HTN (hypertension) 07/21/2012    Willow Ora 07/09/2015, 8:18 PM  Willow Ora, PTA, [facility] [address], [place], [place] [address] [phone] 07/09/2015, 8:18 PM

## 2015-07-13 ENCOUNTER — Ambulatory Visit: Payer: Medicare Other | Admitting: Physical Therapy

## 2015-07-13 ENCOUNTER — Encounter: Payer: Self-pay | Admitting: Physical Therapy

## 2015-07-13 DIAGNOSIS — R6889 Other general symptoms and signs: Secondary | ICD-10-CM

## 2015-07-13 DIAGNOSIS — R269 Unspecified abnormalities of gait and mobility: Secondary | ICD-10-CM | POA: Diagnosis not present

## 2015-07-13 DIAGNOSIS — R2681 Unsteadiness on feet: Secondary | ICD-10-CM

## 2015-07-13 DIAGNOSIS — Z89512 Acquired absence of left leg below knee: Secondary | ICD-10-CM

## 2015-07-13 DIAGNOSIS — R2689 Other abnormalities of gait and mobility: Secondary | ICD-10-CM

## 2015-07-14 ENCOUNTER — Encounter: Payer: Self-pay | Admitting: Physical Therapy

## 2015-07-14 NOTE — Therapy (Signed)
[facility] [address] [place], [place], [address] Phone: [phone]   Fax:  [phone]  Patient Details  Name: Steve Lowery MRN: [id number] Date of Birth: [birth year] Referring Provider:  No ref. provider found  Encounter Date: 07/14/2015  PHYSICAL THERAPY DISCHARGE SUMMARY  Visits from Start of Care:7  Current functional level related to goals / functional outcomes:     PT Long Term Goals - 07/13/15 1315    PT LONG TERM GOAL #1   Title Patient verbalizes / demonstrates proper prosthetic care.  (Target Date: 08/20/2015)   Baseline 07/13/2015 partially MET Patient verbalized proper prosthetic care of basic information PT was able to cover but shortened length of stay he did not recieve all advanced problem solving instructions.   Time 2   Period Months   Status Partially Met   PT LONG TERM GOAL #2   Title Patient tolerates >90% of awake hours without skin issues or discomfort.  (Target Date: 08/20/2015)   Baseline MET 07/13/2015 patient progressed to wearing >90% of awake hours with no issues. However he did have heat rash occasionally which can lead to skin issues if not managed correctly.   Time 2   Period Months   Status Achieved   PT LONG TERM GOAL #3   Title Patient ambulates >500' with LRAD & prosthesis modified independent.  (Target Date: 08/20/2015)   Baseline Partially MET 07/13/2015 Patient ambulated 1300' with prosthesis only modified independent. He does have some balance losses with advanced activities like turning head to look around environment.    Time 2   Period Months   Status Partially Met   PT LONG TERM GOAL #4   Title Patient negotiates ramps, curbs, stairs with LRAD & prosthesis modified independent.  (Target Date: 08/20/2015)   Baseline MET 07/13/2015 Patient negotiates ramps & curbs with prosthesis only with modified technique safely.   Time 2   Period Months   Status Achieved   PT LONG TERM GOAL  #5   Title Berg Balance > 45/56  (Target Date: 08/20/2015)   Baseline MET 07/13/2015 Merrilee Jansky Balance 48/56   Time 2   Period Months   Status Achieved       Remaining deficits: Patient has light heat rash on residual limb with not fully accommodated to new environment of socket and summer heat adding to already present sweating issue with prostheses.   Education / Equipment: Prosthetic care.  Plan: Patient agrees to discharge.  Patient goals were partially met. Patient is being discharged due to meeting the stated rehab goals. and patient is leaving for [place] for 4-5 months.????        Belisa Eichholz PT, DPT 07/14/2015, 10:44 AM  [facility] [address] [place], [place], [address] Phone: [phone]   Fax:  [phone]

## 2015-07-14 NOTE — Therapy (Signed)
[facility] [address] [place], [place], [address] Phone: [phone]   Fax:  [phone]  Physical Therapy Treatment  Patient Details  Name: Steve Lowery MRN: [id number] Date of Birth: February 26, 1934 Referring Provider:  Selinda Orion  Encounter Date: 07/13/2015      PT End of Session - 07/13/15 1315    Visit Number 7   Number of Visits 18   Date for PT Re-Evaluation 08/20/15   Authorization Type G-Code   PT Start Time 1300   PT Stop Time 1347   PT Time Calculation (min) 47 min   Equipment Utilized During Treatment Gait belt   Activity Tolerance Patient tolerated treatment well   Behavior During Therapy [facility] for tasks assessed/performed      Past Medical History  Diagnosis Date  . Diabetes mellitus   . Arthritis   . Kidney disease   . Hypertension     Past Surgical History  Procedure Laterality Date  . Tonsillectomy and adenoidectomy  1942  . Testicle surgery  1982    [facility]  . Amputation Left 03/04/2015    Procedure: AMPUTATION BELOW KNEE;  Surgeon: Meredith Pel, MD;  Location: [facility];  Service: Orthopedics;  Laterality: Left;    There were no vitals filed for this visit.  Visit Diagnosis:  Abnormality of gait  Unsteadiness  Balance problems  Decreased functional activity tolerance  Status post below knee amputation of left lower extremity      Subjective Assessment - 07/13/15 1309    Subjective Going to [place] tomorrow and plans to stay 4-5 months.    Currently in Pain? No/denies            [facility] PT Assessment - 07/13/15 1300    Berg Balance Test   Sit to Stand Able to stand  independently using hands   Standing Unsupported Able to stand safely 2 minutes   Sitting with Back Unsupported but Feet Supported on Floor or Stool Able to sit safely and securely 2 minutes   Stand to Sit Sits safely with minimal use of hands   Transfers Able to transfer safely, minor use of hands    Standing Unsupported with Eyes Closed Able to stand 10 seconds safely   Standing Ubsupported with Feet Together Able to place feet together independently and stand 1 minute safely   From Standing, Reach Forward with Outstretched Arm Can reach confidently >25 cm (10")   From Standing Position, Pick up Object from Floor Able to pick up shoe safely and easily   From Standing Position, Turn to Look Behind Over each Shoulder Looks behind from both sides and weight shifts well   Turn 360 Degrees Able to turn 360 degrees safely but slowly   Standing Unsupported, Alternately Place Feet on Step/Stool Able to complete 4 steps without aid or supervision   Standing Unsupported, One Foot in Front Able to take small step independently and hold 30 seconds   Standing on One Leg Able to lift leg independently and hold 5-10 seconds   Total Score 48                     [facility] Adult PT Treatment/Exercise - 07/13/15 1300    Transfers   Sit to Stand 6: Modified independent (Device/Increase time);With upper extremity assist;From chair/3-in-1   Stand to Sit 6: Modified independent (Device/Increase time);With upper extremity assist;To chair/3-in-1   Ambulation/Gait   Ambulation/Gait Yes   Ambulation/Gait Assistance 6: Modified  independent (Device/Increase time)   Ambulation/Gait Assistance Details no balance   Ambulation Distance (Feet) 1300 Feet  x 1   Assistive device Prosthesis   Gait Pattern Step-through pattern   Ambulation Surface Indoor;Level;Outdoor;Unlevel;Paved;Gravel;Grass   Gait velocity 2.44 ft/sec   Stairs Yes   Stairs Assistance 6: Modified independent (Device/Increase time)   Stair Management Technique One rail Right;Forwards;Alternating pattern   Number of Stairs 4  x 2 reps   Ramp 6: Modified independent (Device)  prosthesis only   Curb 6: Modified independent (Device/increase time)  prosthesis only   Prosthetics   Prosthetic Care Comments  instructed in use of cut-off  socks, arrived with liner slipped ~1", PT instructed in concerns & checking   Current prosthetic wear tolerance (days/week)  daily   Current prosthetic wear tolerance (#hours/day)  all awake hours  drying every 3 hours & prn   Residual limb condition  no skin issues,    Education Provided Correct ply sock adjustment   Person(s) Educated Patient   Education Method Explanation;Demonstration;Verbal cues   Education Method Verbalized understanding;Returned demonstration   Donning Prosthesis Modified independent (device/increased time)   Doffing Prosthesis Modified independent (device/increased time)                PT Education - 07/13/15 1315    Education provided Yes   Education Details ongoing prosthetist follow-ups with recommendation to be aware of [facility] prosthetist if issues arise,    Person(s) Educated Patient   Methods Explanation   Comprehension Verbalized understanding          PT Short Term Goals - 06/30/15 1436    PT SHORT TERM GOAL #1   Title Patient tolerates wear >12hrs/day without skin issues or pain. (Target Date: 07/22/2015)   Time 1   Period Months   Status New   PT SHORT TERM GOAL #2   Title Patient ambulates 300' with single point cane & prosthesis with supervision.  (Target Date: 07/22/2015)   Baseline Met 06/30/15   Time 1   Period Months   Status Achieved   PT SHORT TERM GOAL #3   Title Patient negotiates ramps, curbs & stairs (1 rail) with single point cane & prosthesis with supervision.  (Target Date: 07/22/2015)   Time 1   Period Months   Status New   PT SHORT TERM GOAL #4   Title Berg Balance >40/56  (Target Date: 07/22/2015)   Time 1   Period Months   Status New           PT Long Term Goals - 07/13/15 1315    PT LONG TERM GOAL #1   Title Patient verbalizes / demonstrates proper prosthetic care.  (Target Date: 08/20/2015)   Baseline 07/13/2015 partially MET Patient verbalized proper prosthetic care of basic information PT was able to cover  but shortened length of stay he did not recieve all advanced problem solving instructions.   Time 2   Period Months   Status Partially Met   PT LONG TERM GOAL #2   Title Patient tolerates >90% of awake hours without skin issues or discomfort.  (Target Date: 08/20/2015)   Baseline MET 07/13/2015 patient progressed to wearing >90% of awake hours with no issues. However he did have heat rash occasionally which can lead to skin issues if not managed correctly.   Time 2   Period Months   Status Achieved   PT LONG TERM GOAL #3   Title Patient ambulates >500' with LRAD & prosthesis modified independent.  (  Target Date: 08/20/2015)   Baseline Partially MET July 17, 2015 Patient ambulated 1300' with prosthesis only modified independent. He does have some balance losses with advanced activities like turning head to look around environment.    Time 2   Period Months   Status Partially Met   PT LONG TERM GOAL #4   Title Patient negotiates ramps, curbs, stairs with LRAD & prosthesis modified independent.  (Target Date: 08/20/2015)   Baseline MET 17-Jul-2015 Patient negotiates ramps & curbs with prosthesis only with modified technique safely.   Time 2   Period Months   Status Achieved   PT LONG TERM GOAL #5   Title Berg Balance > 45/56  (Target Date: 08/20/2015)   Baseline MET 07/17/2015 Berg Balance 48/56   Time 2   Period Months   Status Achieved               Plan - 07-17-2015 1315    Clinical Impression Statement Patient has progressed with prosthetic use rapidly. He is going to [place] with expected birth of great-granddaughter with plans to stay for 4-5 months. He has strong basic understanding of prosthesis care & use. He improved balance & mobility with the prosthesis that he has lower fall risk and can access the community safely.    Pt will benefit from skilled therapeutic intervention in order to improve on the following deficits Abnormal gait;Decreased activity tolerance;Decreased balance;Decreased  endurance;Decreased mobility;Decreased strength;Prosthetic Dependency;Postural dysfunction   Rehab Potential Good   PT Frequency 2x / week   PT Duration Other (comment)  9 weeks (60 days)   PT Treatment/Interventions ADLs/Self Care Home Management;Gait training;Stair training;Functional mobility training;Therapeutic activities;Therapeutic exercise;Balance training;Neuromuscular re-education;Patient/family education;Prosthetic Training   PT Next Visit Plan discharge PT as patient is leaving area for prolonged period   Consulted and Agree with Plan of Care Patient          G-Codes - 07-17-15 1315    Functional Assessment Tool Used Patient wearing prosthesis all awake hours. Patient is independent in basic prosthetic care.    Functional Limitation Self care   Self Care Goal Status [phone]) At least 1 percent but less than 20 percent impaired, limited or restricted   Self Care Discharge Status [phone]) At least 20 percent but less than 40 percent impaired, limited or restricted      Problem List Patient Active Problem List   Diagnosis Date Noted  . Protein-calorie malnutrition, severe 03/06/2015  . Diabetic foot infection 03/05/2015  . Necrotizing fasciitis 03/04/2015  . CKD (chronic kidney disease) stage 4, GFR 15-29 ml/min 03/04/2015  . Charcot's joint of left foot 03/04/2015  . Hypokalemia 03/04/2015  . Leukocytosis 03/04/2015  . Pernicious anemia 03/10/2013  . Hyperlipidemia with target LDL less than 100 07/21/2012  . HTN (hypertension) 07/21/2012    Binyamin Nelis PT, DPT 07/14/2015, 8:00 AM  [facility] [address] [place], [place], [address] Phone: [phone]   Fax:  [phone]

## 2015-07-15 ENCOUNTER — Ambulatory Visit: Payer: Medicare Other | Admitting: Physical Therapy

## 2015-07-20 ENCOUNTER — Ambulatory Visit: Payer: Medicare Other | Admitting: Physical Therapy

## 2015-07-22 ENCOUNTER — Ambulatory Visit: Payer: Medicare Other | Admitting: Physical Therapy

## 2015-07-27 ENCOUNTER — Encounter: Payer: Medicare Other | Admitting: Physical Therapy

## 2015-07-29 ENCOUNTER — Encounter: Payer: Medicare Other | Admitting: Physical Therapy

## 2015-08-03 ENCOUNTER — Encounter: Payer: Medicare Other | Admitting: Physical Therapy

## 2015-08-05 ENCOUNTER — Encounter: Payer: Medicare Other | Admitting: Physical Therapy

## 2015-08-10 ENCOUNTER — Encounter: Payer: Medicare Other | Admitting: Physical Therapy

## 2015-08-12 ENCOUNTER — Encounter: Payer: Medicare Other | Admitting: Physical Therapy

## 2015-08-17 ENCOUNTER — Encounter: Payer: Medicare Other | Admitting: Physical Therapy

## 2015-08-19 ENCOUNTER — Encounter: Payer: Medicare Other | Admitting: Physical Therapy

## 2016-05-20 IMAGING — CR DG ANKLE COMPLETE 3+V*L*
3 series · 3 of 3 positions shown · non-contrast
Comparison: None.

CLINICAL DATA: Wound infection. History of diabetes, kidney disease
and arthritis. Initial encounter.

EXAM:
LEFT ANKLE COMPLETE - 3+ VIEW

[x ankle ap left]
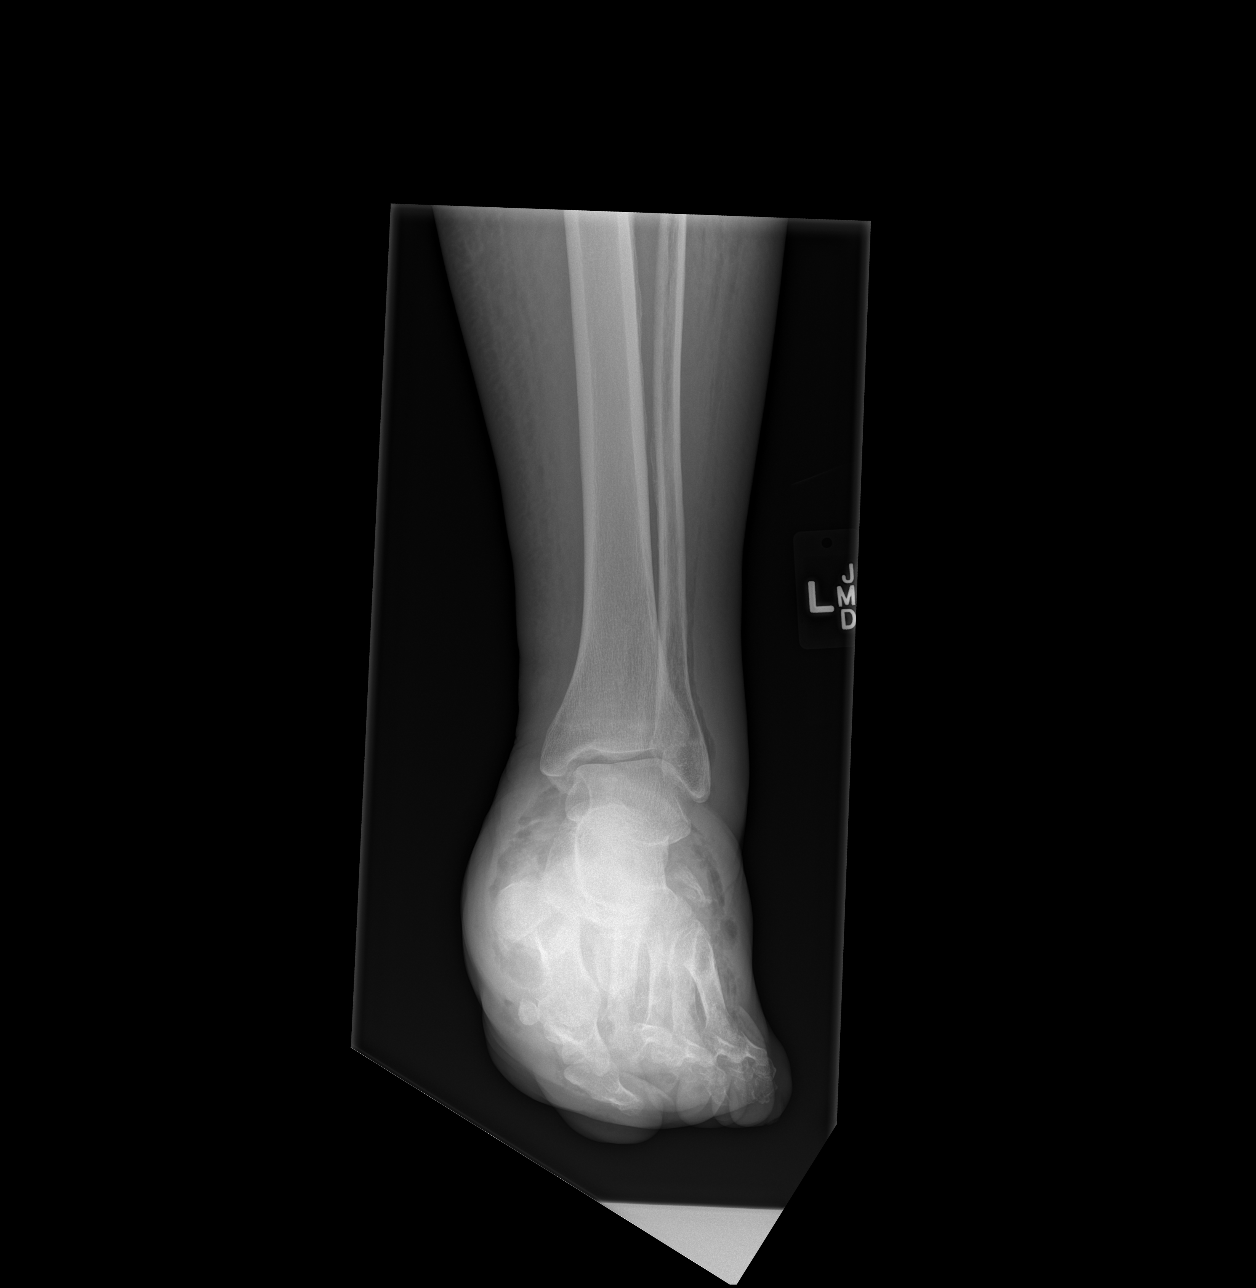

[x ankle obl left]
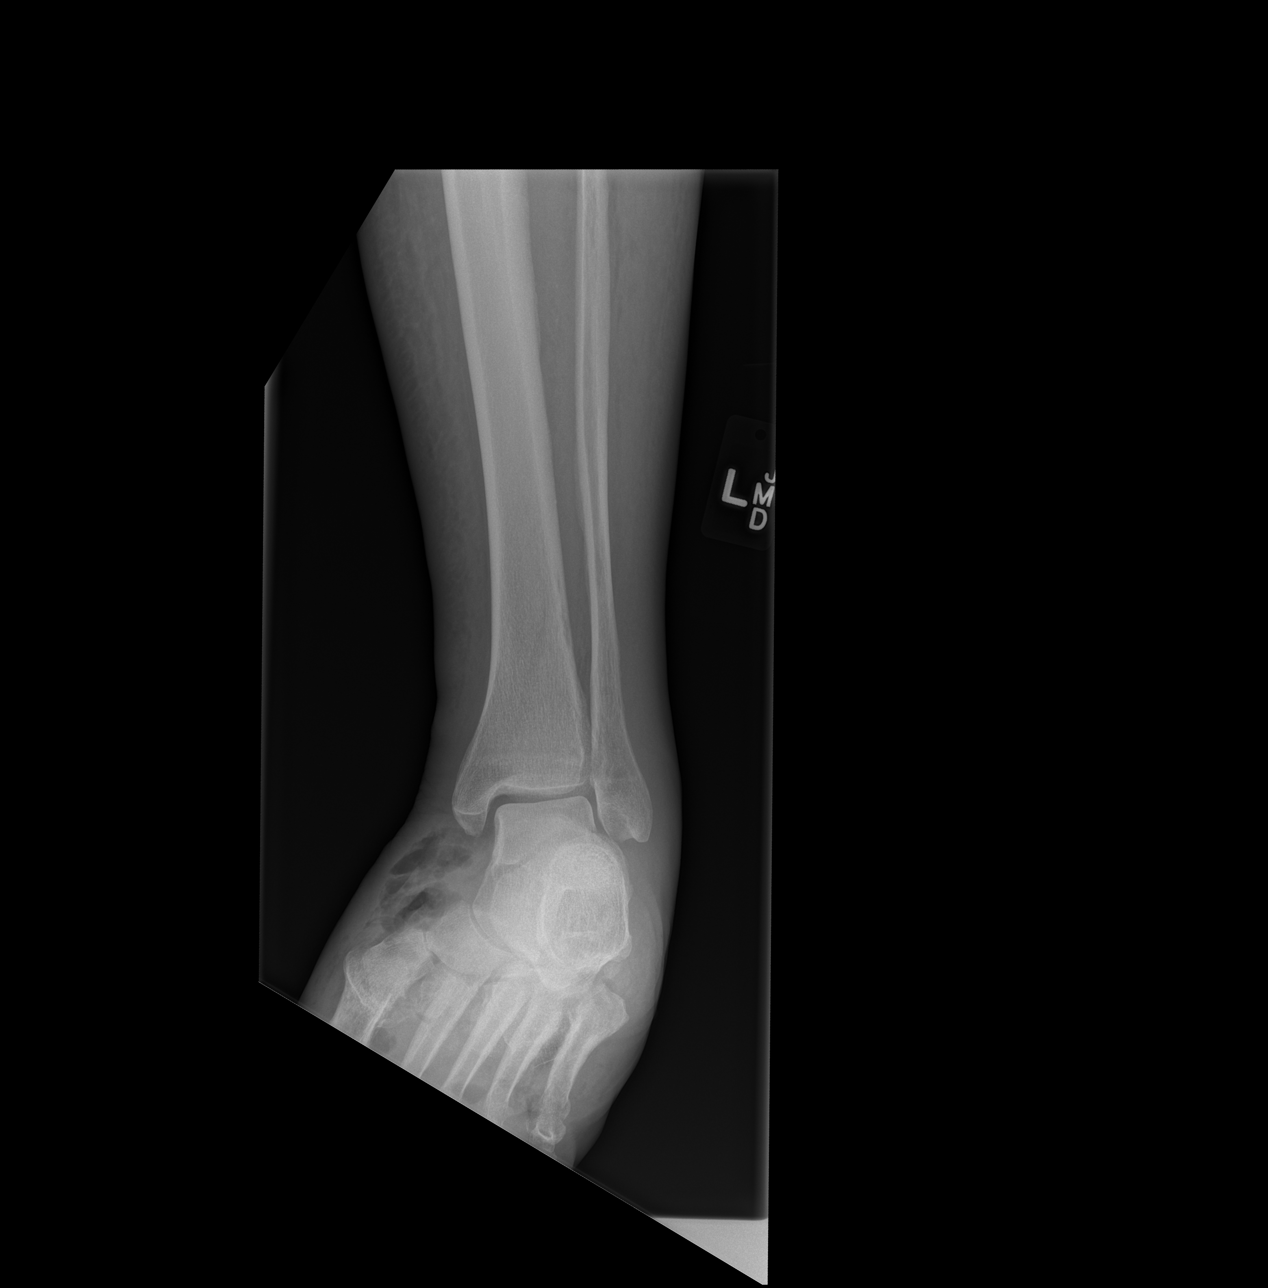

[x ankle lat left]
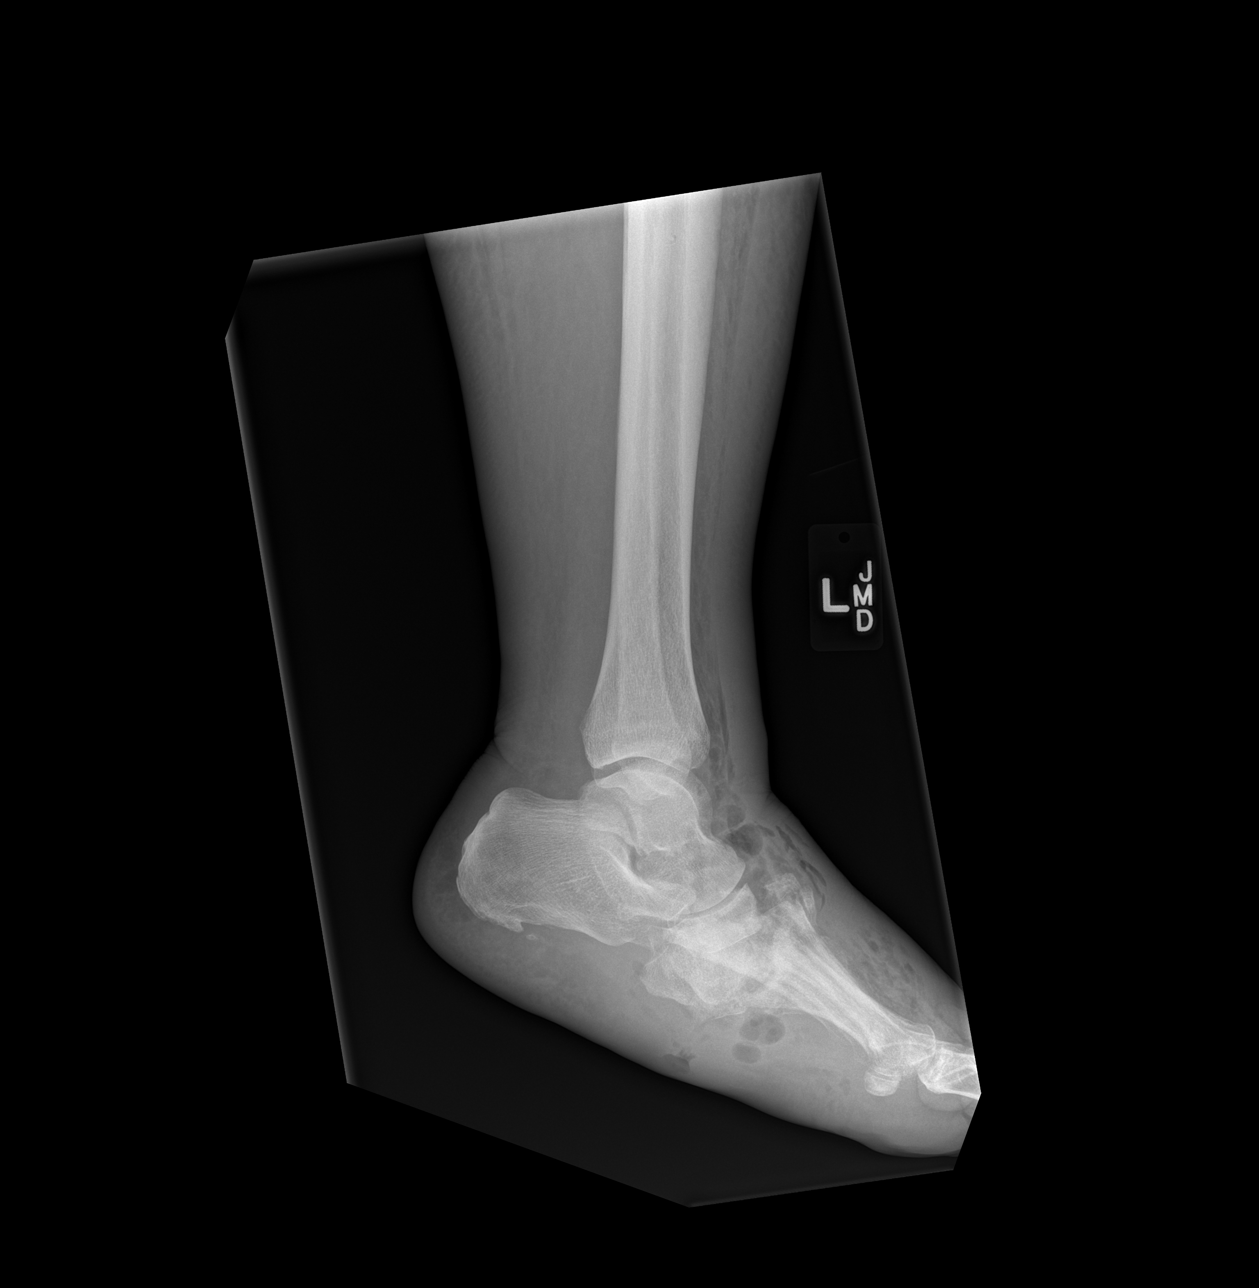

[3 of 3 positions shown; findings below may reference images not displayed]

FINDINGS: At the ankle, there is no evidence of acute fracture, dislocation,
bone destruction or significant arthropathy. Extensive abnormalities
within the foot are described separately. There is soft tissue
emphysema throughout the foot with proximal extension anteriorly
into the distal lower leg. No foreign bodies identified.
IMPRESSION: 1. Proximal extension of soft tissue emphysema into the anterior
aspect of the distal lower leg.
2. No acute or significant osseous findings demonstrated at the
ankle.
3. Extensive left foot abnormalities described separately.

## 2016-05-20 IMAGING — CR DG FOOT COMPLETE 3+V*L*
3 series · 3 of 3 positions shown · non-contrast
Comparison: None.

CLINICAL DATA: Wound infection. History of diabetes, kidney disease
and arthritis. Initial encounter.

EXAM:
LEFT FOOT - COMPLETE 3+ VIEW

[x foot lat left (1 of 3)]
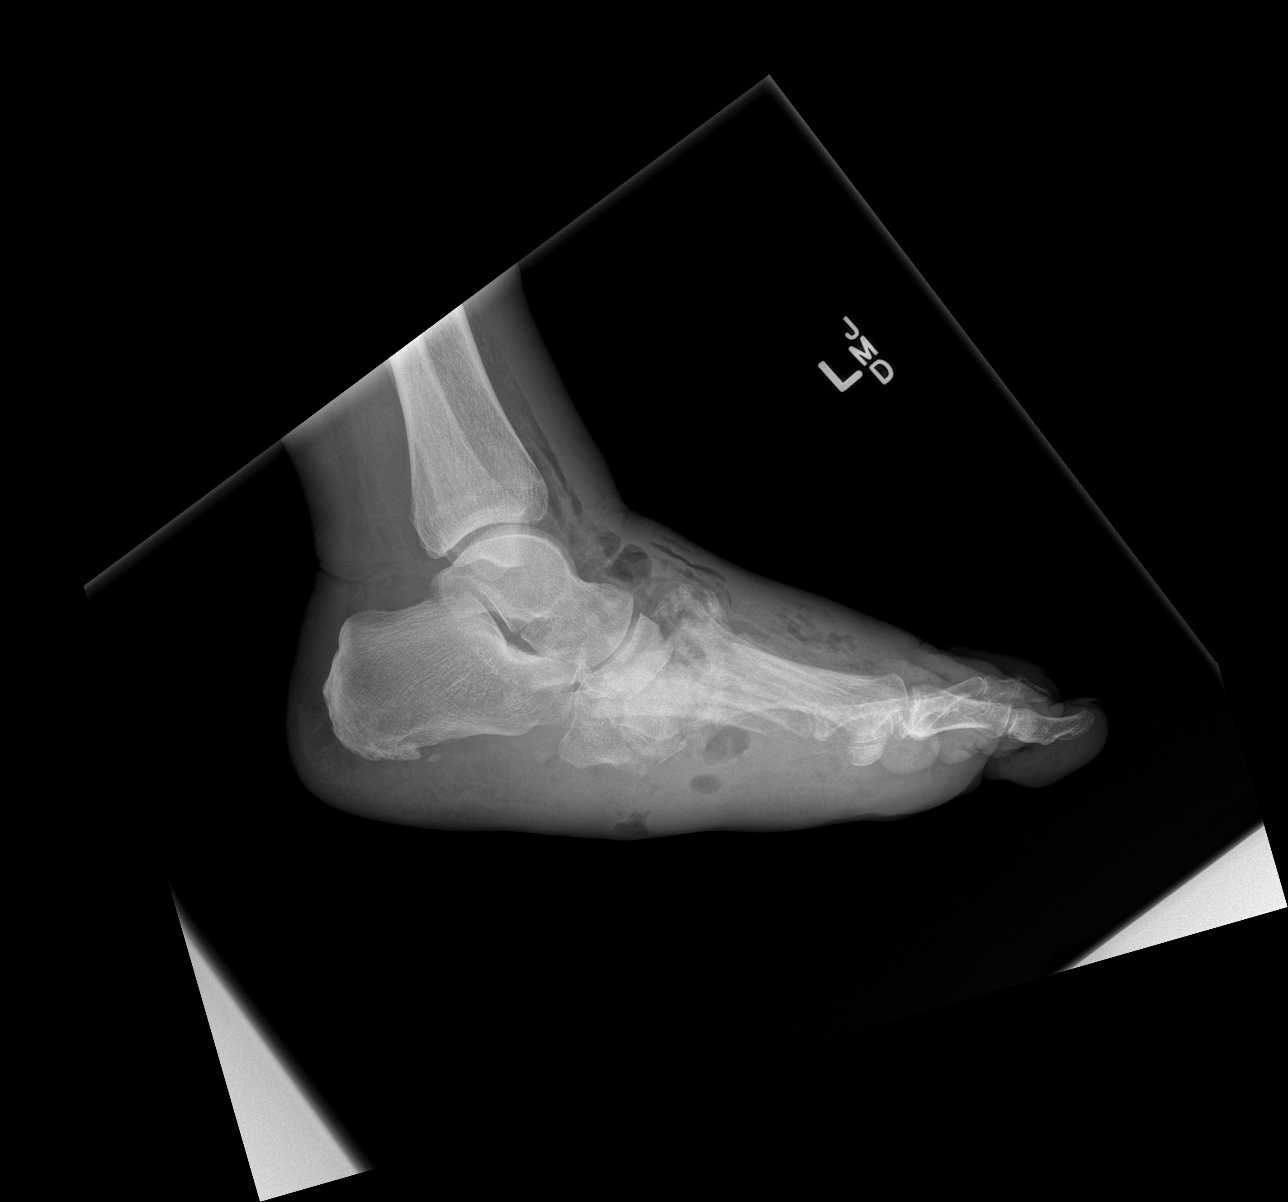

[x foot lat left (2 of 3)]
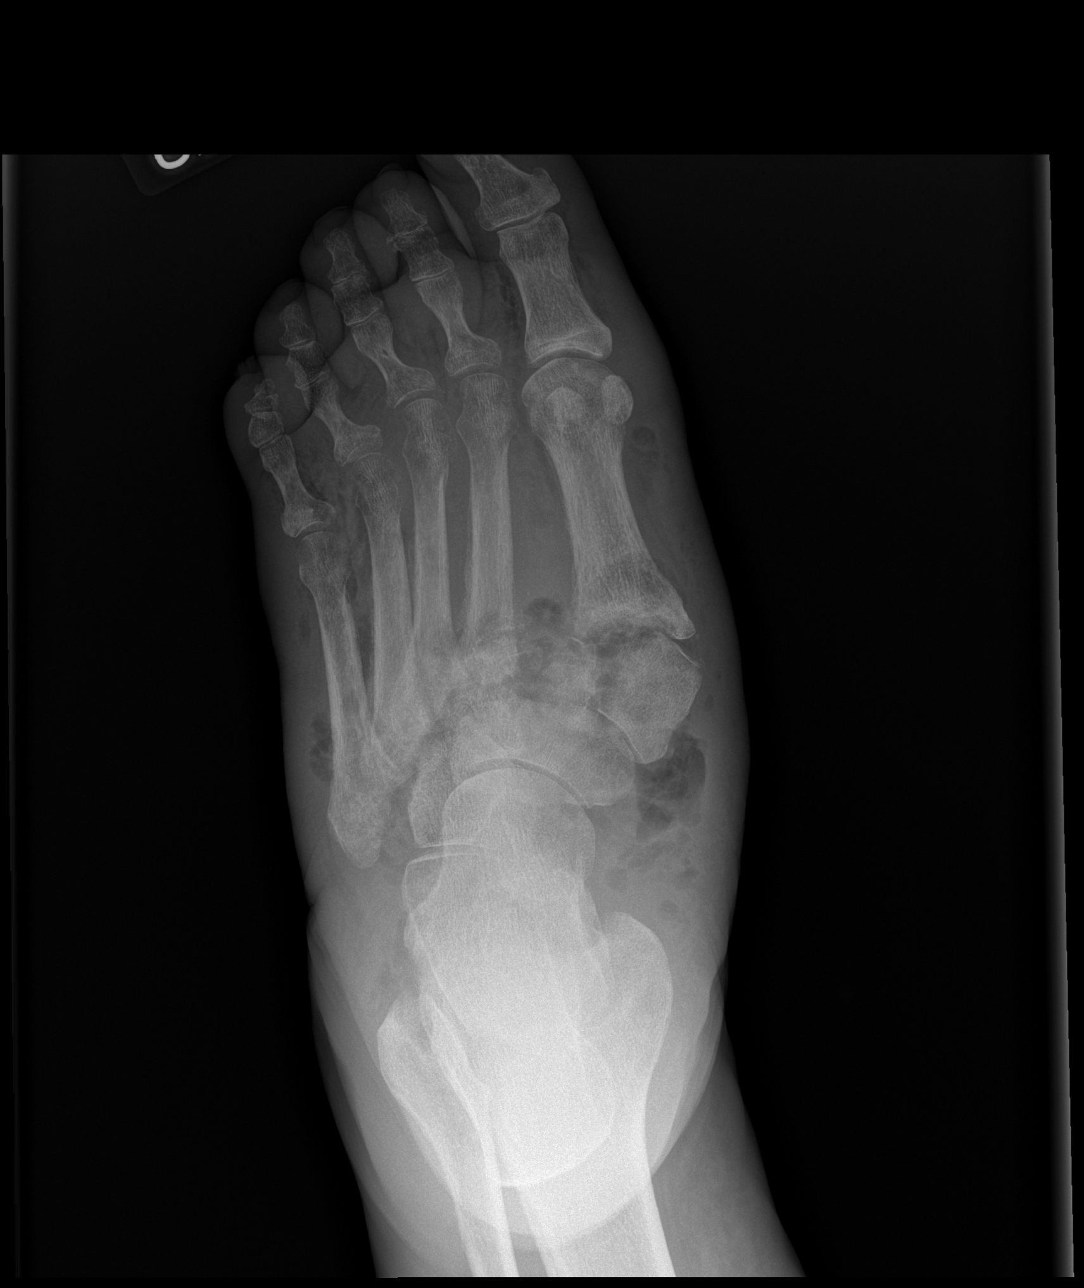

[x foot lat left (3 of 3)]
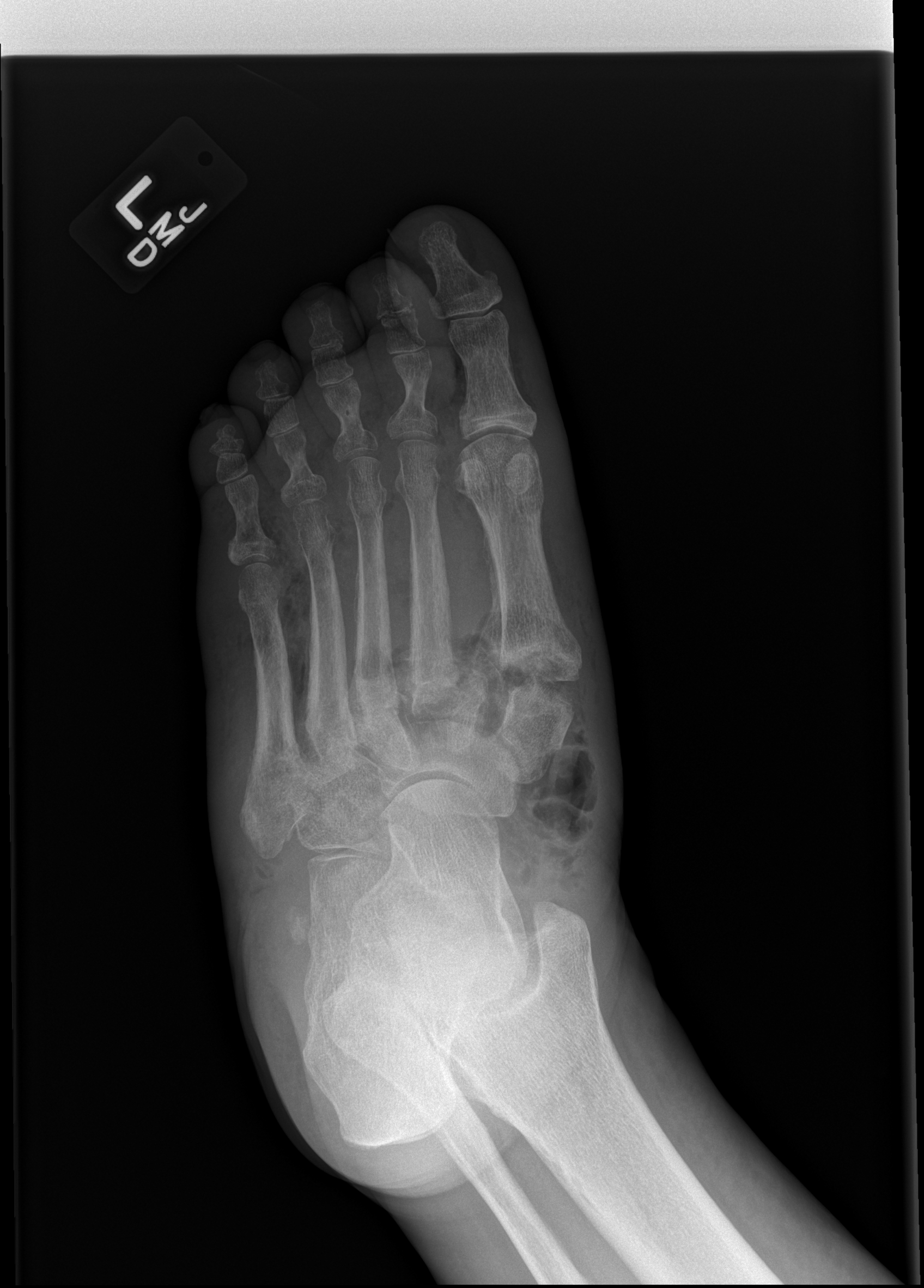

[3 of 3 positions shown; findings below may reference images not displayed]

FINDINGS: The bones are diffusely demineralized. There is extensive
abnormality at the Lisfranc joint with fragmentation of the
metatarsal bases and cuneiform bones and dorsal lateral subluxation.
These findings are consistent with an underlying neuropathic joint.
The talus, calcaneus, cuboid and navicular are intact. There is
extensive soft tissue emphysema throughout the foot with associated
soft tissue swelling, worrisome for superimposed infection. No
foreign bodies identified.
IMPRESSION: 1. Extensive soft tissue emphysema throughout the foot worrisome for
soft tissue infection.
2. No definite signs of osteomyelitis. There are extensive
neuropathic changes at the Lisfranc joint.
3. Consider MRI for further evaluation.

## 2016-05-20 IMAGING — MR MR FOOT*L* W/O CM
5 of 10 series · 19 of 40 positions shown · non-contrast
Comparison: Radiographs 03/04/2015

CLINICAL DATA: Foot deformity with pain, swelling and draining
wounds.

EXAM:
MRI OF THE LEFT FOREFOOT WITHOUT CONTRAST; MRI OF THE LEFT ANKLE
WITHOUT CONTRAST
TECHNIQUE: Multiplanar, multisequence MR imaging was performed. No intravenous
contrast was administered.

[Series 2: PD fat-sat · axial · 4.0mm · 0.33mm/px · z∈[-101,+59]mm · 5 of 33 slices shown]
[im 1/33]
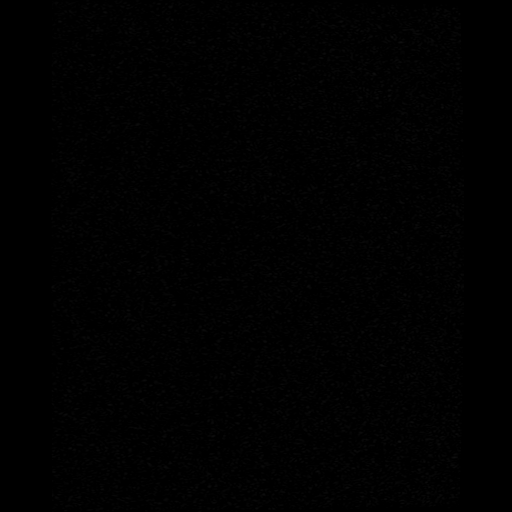
[im 9/33]
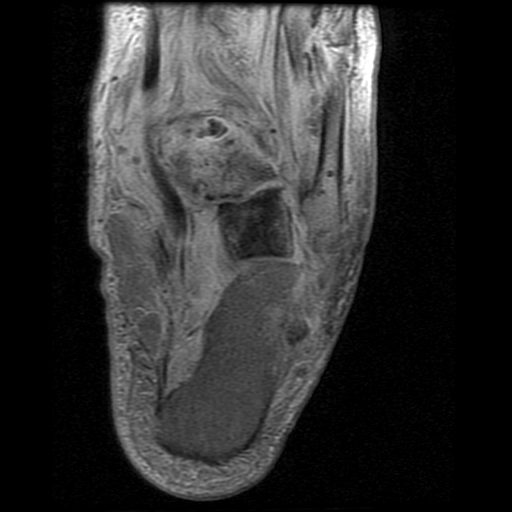
[im 17/33]
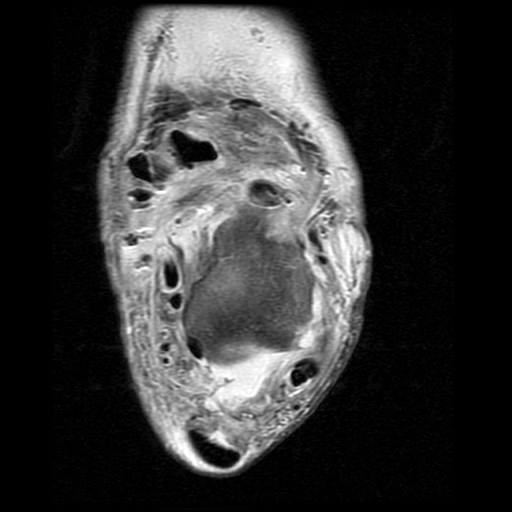
[im 25/33]
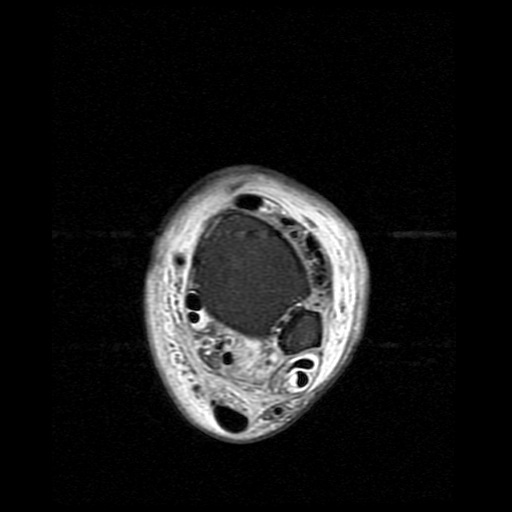
[im 33/33]
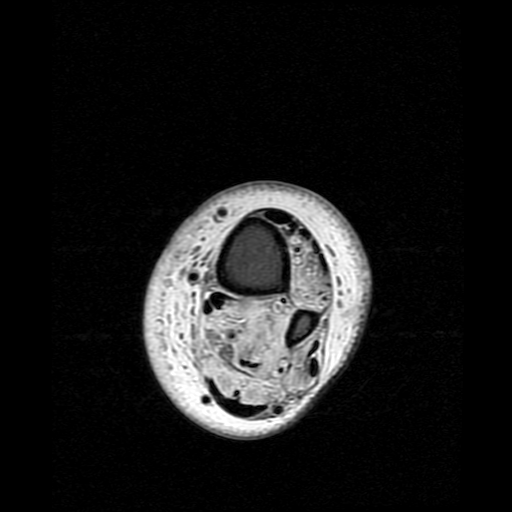

[Series 3: T2 fat-sat · axial · 4.0mm · 0.33mm/px · z∈[-101,+59]mm · 5 of 33 slices shown (1 of 3)]
[im 1/33]
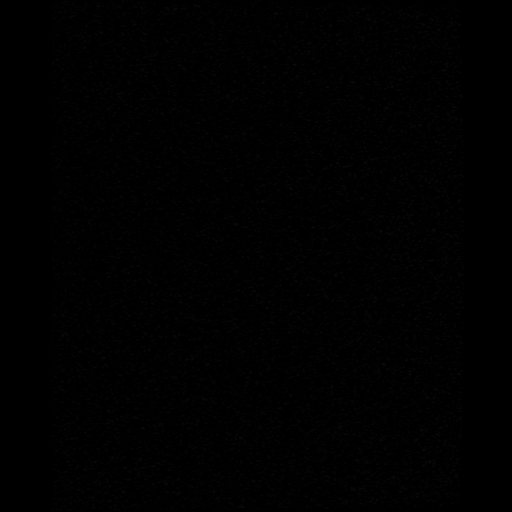
[im 9/33]
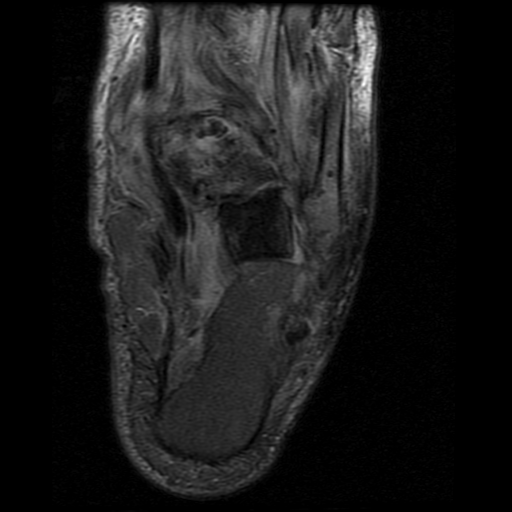
[im 17/33]
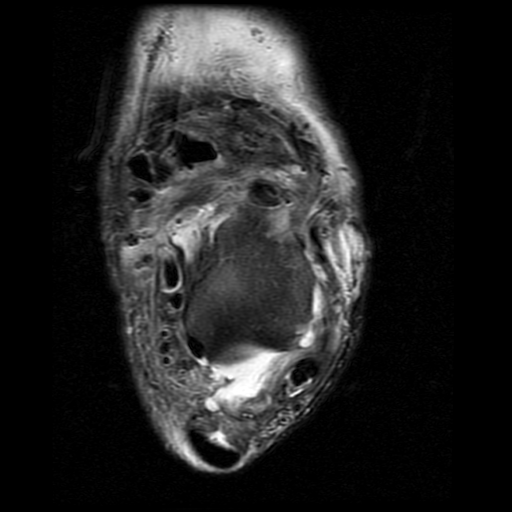
[im 25/33]
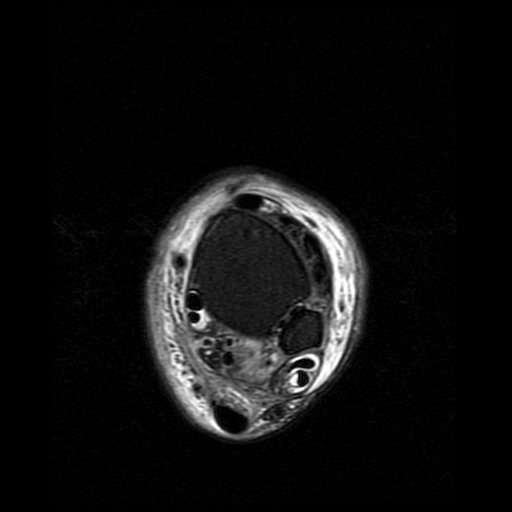
[im 33/33]
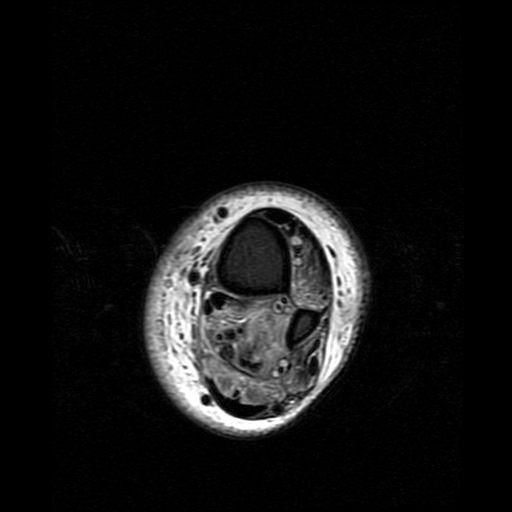

[Series 4: T2 fat-sat · sagittal · 4.0mm · 0.33mm/px · 3 of 24 slices shown (2 of 3)]
[im 1/24]
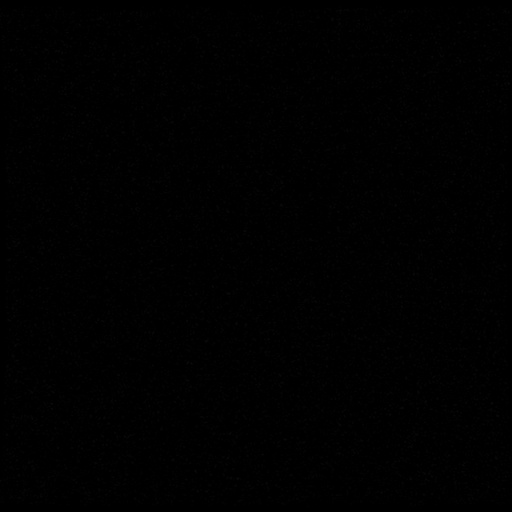
[im 12/24]
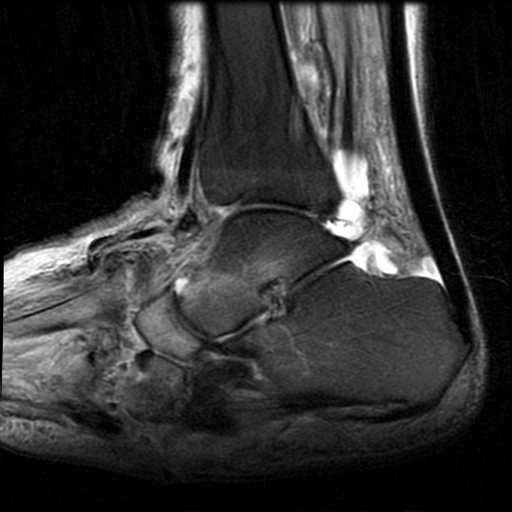
[im 24/24]
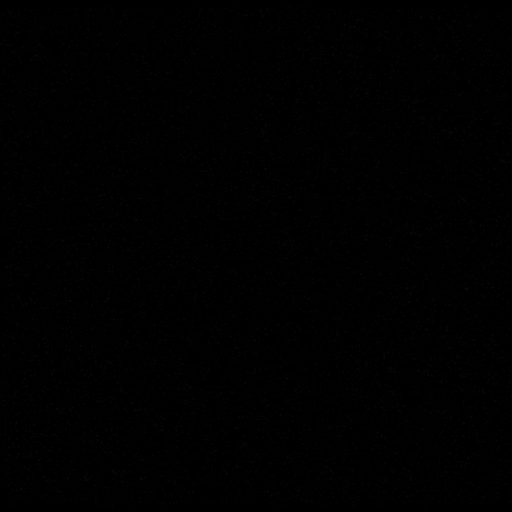

[Series 5: T1 · sagittal · 4.0mm · 0.33mm/px · 1 of 24 slices shown]
[im 1/24]
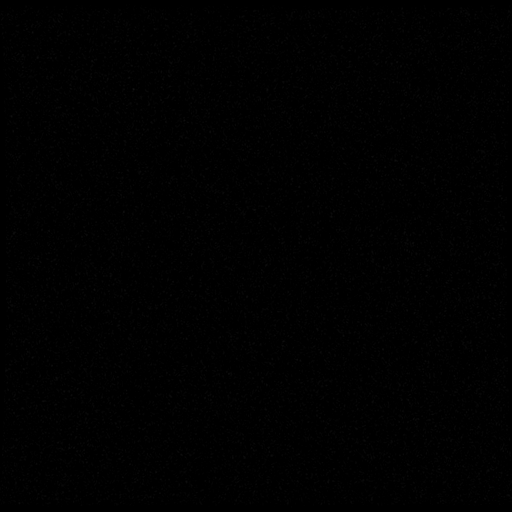

[Series 7: T2 fat-sat · coronal · 4.0mm · 0.33mm/px · 5 of 34 slices shown (3 of 3)]
[im 1/34]
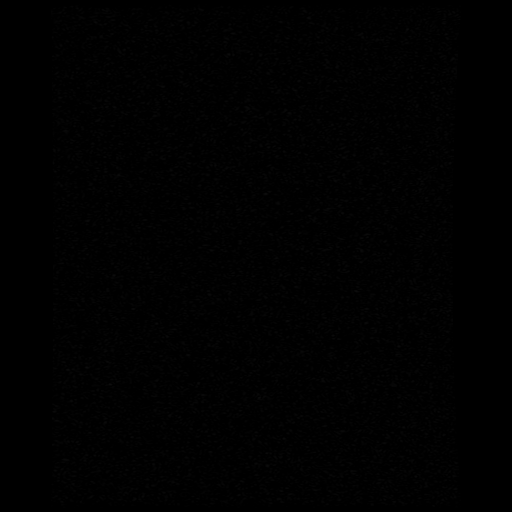
[im 9/34]
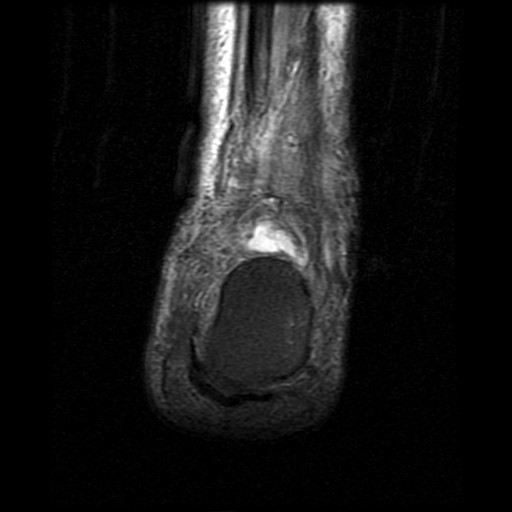
[im 17/34]
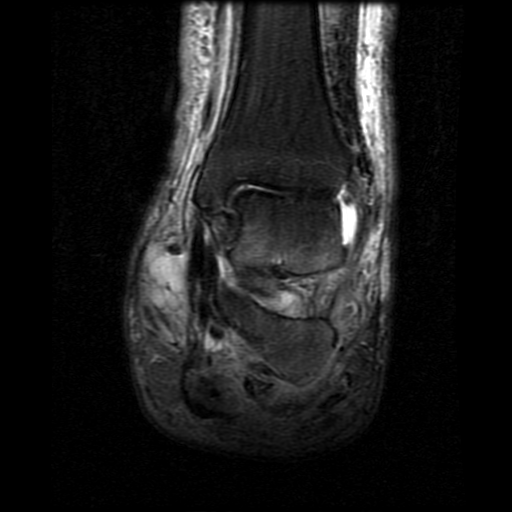
[im 25/34]
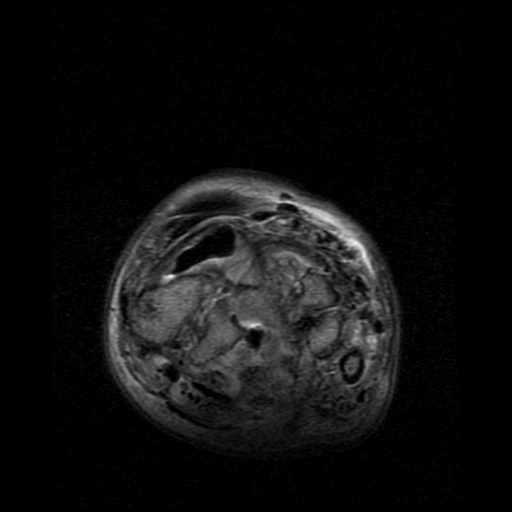
[im 34/34]
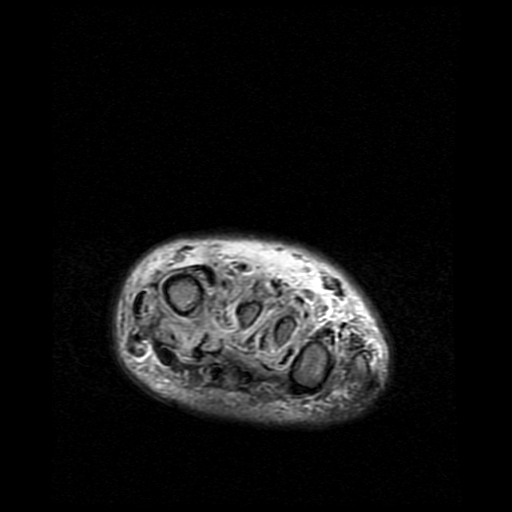

[19 of 40 positions shown; findings below may reference images not displayed]

FINDINGS: As demonstrated on the plain films there are advanced neuropathic
changes involving the midfoot with dorsal and lateral
subluxations/dislocations and bone fragmentation. There is diffuse
gas throughout the soft tissues and within the joints. Findings are
most consistent with underlying neuropathic process and superimposed
septic arthritis and osteomyelitis with diffuse myofasciitis and
cellulitis.

Suspect septic tenosynovitis involving the medial and lateral ankle
tendons extending up above the ankle. I do not see any definite
findings for osteomyelitis involving the tibia or fibula. There is
an ankle joint effusion but no definite destructive bony changes to
suggest septic arthritis involving this joint. The subtalar joints
also appear to be grossly intact.
IMPRESSION: Probable chronic neuropathic changes involving the midfoot with
secondary septic arthritis and osteomyelitis. There is also diffuse
myofasciitis and cellulitis with gas throughout the foot.

No definite involvement of the subtalar joints or ankle joint.

Probable septic tenosynovitis involving the medial and lateral ankle
tendons.

## 2017-08-17 ENCOUNTER — Other Ambulatory Visit: Payer: Self-pay | Admitting: Nephrology

## 2017-08-17 DIAGNOSIS — N184 Chronic kidney disease, stage 4 (severe): Secondary | ICD-10-CM

## 2017-08-21 ENCOUNTER — Other Ambulatory Visit: Payer: Medicare Other

## 2018-01-04 DEATH — deceased
